# Patient Record
Sex: Female | Born: 1965 | Race: White | Hispanic: No | Marital: Married | Smoking: Former smoker
Health system: Southern US, Community
[De-identification: ages and names within clinical notes are randomized; demographics above are authoritative.]

## PROBLEM LIST (undated history)

## (undated) DIAGNOSIS — Z9889 Other specified postprocedural states: Secondary | ICD-10-CM

## (undated) DIAGNOSIS — R232 Flushing: Principal | ICD-10-CM

## (undated) DIAGNOSIS — L4050 Arthropathic psoriasis, unspecified: Secondary | ICD-10-CM

## (undated) DIAGNOSIS — E119 Type 2 diabetes mellitus without complications: Secondary | ICD-10-CM

## (undated) DIAGNOSIS — C801 Malignant (primary) neoplasm, unspecified: Secondary | ICD-10-CM

## (undated) DIAGNOSIS — I10 Essential (primary) hypertension: Secondary | ICD-10-CM

## (undated) DIAGNOSIS — F419 Anxiety disorder, unspecified: Secondary | ICD-10-CM

## (undated) DIAGNOSIS — L408 Other psoriasis: Secondary | ICD-10-CM

## (undated) DIAGNOSIS — E785 Hyperlipidemia, unspecified: Secondary | ICD-10-CM

## (undated) DIAGNOSIS — R000 Tachycardia, unspecified: Secondary | ICD-10-CM

## (undated) DIAGNOSIS — N189 Chronic kidney disease, unspecified: Secondary | ICD-10-CM

## (undated) DIAGNOSIS — N393 Stress incontinence (female) (male): Secondary | ICD-10-CM

## (undated) DIAGNOSIS — Z79890 Hormone replacement therapy (postmenopausal): Secondary | ICD-10-CM

## (undated) DIAGNOSIS — Z780 Asymptomatic menopausal state: Secondary | ICD-10-CM

## (undated) DIAGNOSIS — L308 Other specified dermatitis: Secondary | ICD-10-CM

## (undated) HISTORY — DX: Flushing: R23.2

## (undated) HISTORY — DX: Malignant (primary) neoplasm, unspecified: C80.1

## (undated) HISTORY — DX: Stress incontinence (female) (male): N39.3

## (undated) HISTORY — DX: Asymptomatic menopausal state: Z78.0

## (undated) HISTORY — PX: CARDIAC ELECTROPHYSIOLOGY MAPPING AND ABLATION: SHX1292

## (undated) HISTORY — PX: CHOLECYSTECTOMY: SHX55

## (undated) HISTORY — PX: TUBAL LIGATION: SHX77

## (undated) HISTORY — DX: Arthropathic psoriasis, unspecified: L40.50

## (undated) HISTORY — DX: Essential (primary) hypertension: I10

## (undated) HISTORY — DX: Anxiety disorder, unspecified: F41.9

## (undated) HISTORY — DX: Other psoriasis: L40.8

## (undated) HISTORY — DX: Type 2 diabetes mellitus without complications: E11.9

## (undated) HISTORY — DX: Other specified dermatitis: L30.8

## (undated) HISTORY — DX: Tachycardia, unspecified: R00.0

## (undated) HISTORY — DX: Hyperlipidemia, unspecified: E78.5

## (undated) HISTORY — PX: ENDOMETRIAL ABLATION: SHX621

## (undated) HISTORY — DX: Hormone replacement therapy: Z79.890

## (undated) HISTORY — DX: Chronic kidney disease, unspecified: N18.9

---

## 2001-01-01 ENCOUNTER — Other Ambulatory Visit: Admission: RE | Admit: 2001-01-01 | Discharge: 2001-01-01 | Payer: Self-pay | Admitting: Obstetrics and Gynecology

## 2001-01-18 ENCOUNTER — Ambulatory Visit (HOSPITAL_COMMUNITY): Admission: RE | Admit: 2001-01-18 | Discharge: 2001-01-18 | Payer: Self-pay | Admitting: Obstetrics and Gynecology

## 2001-11-22 ENCOUNTER — Encounter: Payer: Self-pay | Admitting: Urology

## 2001-11-22 ENCOUNTER — Ambulatory Visit (HOSPITAL_COMMUNITY): Admission: RE | Admit: 2001-11-22 | Discharge: 2001-11-22 | Payer: Self-pay | Admitting: Urology

## 2001-11-25 ENCOUNTER — Other Ambulatory Visit: Admission: RE | Admit: 2001-11-25 | Discharge: 2001-11-25 | Payer: Self-pay | Admitting: Dermatology

## 2004-02-04 ENCOUNTER — Ambulatory Visit (HOSPITAL_COMMUNITY): Admission: RE | Admit: 2004-02-04 | Discharge: 2004-02-04 | Payer: Self-pay | Admitting: Obstetrics and Gynecology

## 2005-01-06 ENCOUNTER — Ambulatory Visit (HOSPITAL_COMMUNITY): Admission: RE | Admit: 2005-01-06 | Discharge: 2005-01-06 | Payer: Self-pay | Admitting: Surgery

## 2006-02-02 ENCOUNTER — Ambulatory Visit (HOSPITAL_COMMUNITY): Admission: RE | Admit: 2006-02-02 | Discharge: 2006-02-02 | Payer: Self-pay | Admitting: Obstetrics & Gynecology

## 2007-02-01 ENCOUNTER — Ambulatory Visit (HOSPITAL_COMMUNITY): Admission: RE | Admit: 2007-02-01 | Discharge: 2007-02-01 | Payer: Self-pay | Admitting: Obstetrics and Gynecology

## 2007-02-18 ENCOUNTER — Other Ambulatory Visit: Admission: RE | Admit: 2007-02-18 | Discharge: 2007-02-18 | Payer: Self-pay | Admitting: Obstetrics and Gynecology

## 2008-02-20 ENCOUNTER — Other Ambulatory Visit: Admission: RE | Admit: 2008-02-20 | Discharge: 2008-02-20 | Payer: Self-pay | Admitting: Obstetrics and Gynecology

## 2008-02-26 ENCOUNTER — Ambulatory Visit (HOSPITAL_COMMUNITY): Admission: RE | Admit: 2008-02-26 | Discharge: 2008-02-26 | Payer: Self-pay | Admitting: Obstetrics and Gynecology

## 2009-03-24 ENCOUNTER — Ambulatory Visit (HOSPITAL_COMMUNITY): Admission: RE | Admit: 2009-03-24 | Discharge: 2009-03-24 | Payer: Self-pay | Admitting: Obstetrics and Gynecology

## 2010-09-30 NOTE — Op Note (Signed)
NAME:  Rebekah Ferrell, Rebekah Ferrell            ACCOUNT NO.:  [id number]   MEDICAL RECORD NO.:  [id number]          PATIENT TYPE:  AMB   LOCATION:  DAY                           FACILITY:  [facility]   PHYSICIAN:  Tilda Burrow, M.D. DATE OF BIRTH:  05-25-1965   DATE OF PROCEDURE:  02/04/2004  DATE OF DISCHARGE:                                 OPERATIVE REPORT   PREOPERATIVE DIAGNOSIS:  Menometrorrhagia.   POSTOPERATIVE DIAGNOSIS:  Menometrorrhagia.   PROCEDURE:  Hysteroscopy, dilation and curettage, endometrial ablation.   SURGEON:  Tilda Burrow, M.D.   ASSISTANTAmie Critchley, C.S.T.   ANESTHESIA:  Dorisann Frames, C.R.N.A.   COMPLICATIONS:  None.   FINDINGS:  Uterus sounding to 8 cm preprocedure.  Smooth endometrial cavity  without polyps or fibroids.   DESCRIPTION OF PROCEDURE:  The patient was taken to the operating room.  General anesthesia was introduced.  A speculum was inserted.  The cervix was  grasped, and a paracervical block was applied with Marcaine solution with  epinephrine.   The cervix was sounded to 8 cm, dilated to 31 [place], allowing introduction  of a 30-degree diagnostic hysteroscope, with visualization of a very thin,  smooth, uniform endometrial contour.  Smooth sharp curettage was performed  enough to obtain a small sample, and then we proceeded with endometrial  ablation.  The ablation consisted of priming the Gynecare ThermaChoice Third  Generation balloon in standard fashion at -150 mmHg, inserting it, dilating  it with 10 cc of D5W, heating to 87 degrees, and performing the standard  eight-minute heat cycle, followed by deflation of the balloon, obtaining all  10 cc of fluid, removal of instruments, and allowing the patient to go to  the recovery room in good condition.  She was discharged, with medications  being Darvocet-N 100 and Motrin.     John   JVF/MEDQ  D:  02/04/2004  T:  02/04/2004  Job:  [id number]

## 2010-09-30 NOTE — H&P (Signed)
NAME:  Rebekah Ferrell, Rebekah Ferrell NO.:  [id number]   MEDICAL RECORD NO.:  [id number]           PATIENT TYPE:   LOCATION:                                 FACILITY:   PHYSICIAN:  Tilda Burrow, M.D.      DATE OF BIRTH:   DATE OF ADMISSION:  02/04/2004  DATE OF DISCHARGE:  LH                                HISTORY & PHYSICAL   ADMISSION DIAGNOSES:  1.  Heavy and prolonged menses (menometrorrhagia).  2.  Type 1 diabetes.   HISTORY OF PRESENT ILLNESS:  This 45 year old female, gravida 2, para 2, AB1  (twins x 1), status post tubal ligation, is admitted at this time for  hysteroscopy D&C and endometrial ablation.  [name] has developed  progressively intolerable periods.  She has been followed in our office.  She is now bleeding 10 to 12 days per month with occasional intramenstrual  bleeding.  She is diabetic and hypertensive, and oral contraceptives are not  considered an option.  She has had an ultrasound in our office which shows  essentially normal size uterus measuring 7.6 x 3.9 x 4.9 cm, total volume  estimated at 76 ml with smooth endometrial cavity and 3 mm endometrial  stripe.  Potential for endometrial pathology is considered minimal.  She had  counseling over treatment options at this time and is familiar with  endometrial ablation from review of Gynecare ThermaChoice endometrial  ablation brochures, discussion in the office, and Internet research.  She  understands the proposed procedure and requests endometrial ablation.  Ten  percent failure rates are being quoted to patient with 90% patient  satisfaction due to diminishing or eliminated menses.  Surgery is scheduled  for February 04, 2004, at [facility].   PAST MEDICAL HISTORY:  1.  Type 1 diabetes beginning in patient's 20's.  2.  History of kidney stones.  3.  History of hepatitis A as a teenager.  4.  History of HELLP syndrome with her pregnancy (hemolysis, elevated      enzymes, and low  platelets).   PAST SURGICAL HISTORY:  1.  Cholecystectomy in 2000.  2.  Tubal ligation in 2002.   OBSTETRICAL HISTORY:  Twin pregnancy with vaginal delivery x 2 in 1999.   MEDICATIONS:  1.  Humulin 75/25 mix with 12 to 15 units q.a.m., with 35 to 50 units      q.p.m., taken on sliding scale based on food intake.  2.  Lisinopril.  3.  Spironolactone managed by Dr. Patrecia Pace.  4.  Lexapro.   SOCIAL HISTORY:  Nonsmoker, nondrinker.  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  Menstrual flow has been heavy for most of the last year with  premenstrual mood changes noted as well.   PHYSICAL EXAMINATION:  GENERAL:  Healthy-appearing, Caucasian female, alert  and oriented x 3.  VITAL SIGNS:  Weight 184, blood pressure 130/70, height 5 feet 2 inches.  HEENT:  Pupils equal, round, and reactive.  Extraocular movements intact.  NECK:  Supple.  CHEST:  Clear to auscultation.  ABDOMEN:  Moderate abdominal wall thickening without masses.  PELVIC:  External genitalia normal.  Cervix multiparous.  Class I Pap smear  August  2005.  Uterus anteflexed with small measurements estimated at 76 ml  total volume with thin endometrial stripe.  Adnexa is negative for  pathology.   IMPRESSION:  1.  Menometrorrhagia.  2.  Type 1 diabetes.  3.  Chronic hypertension.   PLAN:  Hysteroscopy D&C endometrial ablation February 04, 2004.      JVF/MEDQ  D:  01/31/2004  T:  01/31/2004  Job:  [id number]   cc:   [facility]  Fax: [phone]

## 2010-09-30 NOTE — Op Note (Signed)
[facility]  Patient:    Rebekah Ferrell, Rebekah Ferrell Visit Number: [id number] MRN: [id number]          Service Type: Attending:  Christin Bach, M.D. Dictated by:   Christin Bach, M.D. Proc. Date: 01/18/01                             Operative Report  PREOPERATIVE DIAGNOSIS:  Elective sterilization.  POSTOPERATIVE DIAGNOSIS:  Elective sterilization.  PROCEDURE:  Laparoscopic tubal sterilization with Falope rings.  SURGEON:  Christin Bach, M.D.  ASSISTANT:  None.  ANESTHESIA:  General with endotracheal intubation.  COMPLICATIONS:  None.  ESTIMATED BLOOD LOSS:  Minimal.  FINDINGS:  Stenotic cervix, unable to insert a Hulka tenaculum, managed with a single-tooth tenaculum.  INDICATION:  Elective permanent sterilization.  DETAILS OF PROCEDURE:  The patient was taken to the operating room, prepped and draped for a combined abdominal and vaginal procedure, with Hulka tenaculum attached to the cervix for uterine manipulation. Bladder in-and-out catheterization. An infraumbilical, 1 cm vertical incision, as well as a transverse suprapubic 1 cm incision. Veress needle was used to introduce pneumoperitoneum through the umbilical incision with the pneumoperitoneum easily introduced under 10 mmHg of pressure. Introduction of the Veress needle was done, carefully elevating the abdominal wall and orienting the needle toward the pelvis.  The laparoscopic trocar was then carefully introduced into the abdomen using a similar technique, and the laparoscope was used to visualize normal pelvic anatomy with no evidence of bleeding or trauma. The suprapubic trocar was introduced under direct visualization, and then attention was directed to the left fallopian tube, which was identified up to its fimbriated end, elevated and a mid-segment loop of the tube was drawn up into the Falope ring applier, Marcaine 0.25% applied to the surface of the tube and the Falope ring  applied, inspected, and found to be in satisfactory position. The opposite tube was then treated in a similar fashion. The mesosalpinx beneath the Falope ring on each side was then infiltrated with approximately 3 cc of Marcaine 0.25%, using a transabdominal approach with a 22-gauge spinal needle.  Then, the laparoscopic equipment was removed after instilling 200 cc of saline into the abdomen and deflating the abdomen. Subcuticular 4-0 Dexon was used to close the skin incisions and Steri-Strips was placed on the skin surface. Sponge and needle counts were correct. The patient tolerated the procedure well, was awakened, and went to the recovery room in good condition. Dictated by:   Christin Bach, M.D. Attending:  Christin Bach, M.D. DD:  02/17/01 TD:  02/18/01 Job: [id number]

## 2011-11-23 ENCOUNTER — Other Ambulatory Visit: Payer: Self-pay | Admitting: Obstetrics and Gynecology

## 2011-11-23 DIAGNOSIS — Z139 Encounter for screening, unspecified: Secondary | ICD-10-CM

## 2011-11-28 ENCOUNTER — Ambulatory Visit (HOSPITAL_COMMUNITY)
Admission: RE | Admit: 2011-11-28 | Discharge: 2011-11-28 | Disposition: A | Discharge status: Home / Self Care | Payer: BC Managed Care – PPO | Source: Ambulatory Visit | Attending: Obstetrics and Gynecology | Admitting: Obstetrics and Gynecology

## 2011-11-28 DIAGNOSIS — Z1231 Encounter for screening mammogram for malignant neoplasm of breast: Secondary | ICD-10-CM | POA: Insufficient documentation

## 2011-11-28 DIAGNOSIS — Z139 Encounter for screening, unspecified: Secondary | ICD-10-CM

## 2011-12-13 ENCOUNTER — Other Ambulatory Visit: Payer: Self-pay | Admitting: Adult Health

## 2011-12-13 ENCOUNTER — Other Ambulatory Visit (HOSPITAL_COMMUNITY)
Admission: RE | Admit: 2011-12-13 | Discharge: 2011-12-13 | Disposition: A | Discharge status: Home / Self Care | Payer: BC Managed Care – PPO | Source: Ambulatory Visit | Attending: Obstetrics and Gynecology | Admitting: Obstetrics and Gynecology

## 2011-12-13 DIAGNOSIS — Z1151 Encounter for screening for human papillomavirus (HPV): Secondary | ICD-10-CM | POA: Insufficient documentation

## 2011-12-13 DIAGNOSIS — Z01419 Encounter for gynecological examination (general) (routine) without abnormal findings: Secondary | ICD-10-CM | POA: Insufficient documentation

## 2012-12-10 ENCOUNTER — Other Ambulatory Visit: Payer: Self-pay | Admitting: Physician Assistant

## 2013-03-26 ENCOUNTER — Other Ambulatory Visit: Payer: Self-pay | Admitting: Physician Assistant

## 2013-07-15 ENCOUNTER — Encounter: Payer: Self-pay | Admitting: Adult Health

## 2013-07-15 ENCOUNTER — Ambulatory Visit (INDEPENDENT_AMBULATORY_CARE_PROVIDER_SITE_OTHER): Payer: BC Managed Care – PPO | Admitting: Adult Health

## 2013-07-15 VITALS — BP 120/76 | Ht 63.0 in | Wt 188.0 lb

## 2013-07-15 DIAGNOSIS — E119 Type 2 diabetes mellitus without complications: Secondary | ICD-10-CM

## 2013-07-15 DIAGNOSIS — N951 Menopausal and female climacteric states: Secondary | ICD-10-CM

## 2013-07-15 DIAGNOSIS — R232 Flushing: Secondary | ICD-10-CM

## 2013-07-15 HISTORY — DX: Flushing: R23.2

## 2013-07-15 MED ORDER — ALPRAZOLAM 0.5 MG PO TABS: 0.5000 mg | ORAL_TABLET | Freq: Three times a day (TID) | ORAL | Status: DC | PRN

## 2013-07-15 NOTE — Progress Notes (Signed)
Subjective:     Patient ID: Rebekah Ferrell, female   DOB: March 30, 1966, 48 y.o.   MRN: [id number]  HPI Rebekah Ferrell is a 48 year old white female in complaining of hot flashes, night sweats and moodiness, she has no period sp ablation.She is diabetic.  Review of Systems See HPI Reviewed past medical,surgical, social and family history. Reviewed medications and allergies.     Objective:   Physical Exam BP 120/76  Ht 5\' 3"  (1.6 m)  Wt 188 lb (85.276 kg)  BMI 33.31 kg/m2   Discussed menopausal symptoms and treatment options, will check labs first, then decide on treatment.Pt is aware of risks and benefits of options.  Assessment:     Hot flashes Diabetes     Plan:    Refilled xanax 0.5 mg #90 1 tid prn with 3 refills  Check CBC,CMP,TSH, FSH and A1c   Review handout on menopause Will talk tomorrow

## 2013-07-15 NOTE — Patient Instructions (Signed)
Menopause Menopause is the normal time of life when menstrual periods stop completely. Menopause is complete when you have missed 12 consecutive menstrual periods. It usually occurs between the ages of 8 years and 48 years. Very rarely does a woman develop menopause before the age of 60 years. At menopause, your ovaries stop producing the female hormones estrogen and progesterone. This can cause undesirable symptoms and also affect your health. Sometimes the symptoms may occur 4 5 years before the menopause begins. There is no relationship between menopause and:  Oral contraceptives.  Number of children you had.  Race.  The age your menstrual periods started (menarche). Heavy smokers and very thin women may develop menopause earlier in life. CAUSES  The ovaries stop producing the female hormones estrogen and progesterone.  Other causes include:  Surgery to remove both ovaries.  The ovaries stop functioning for no known reason.  Tumors of the pituitary gland in the brain.  Medical disease that affects the ovaries and hormone production.  Radiation treatment to the abdomen or pelvis.  Chemotherapy that affects the ovaries. SYMPTOMS   Hot flashes.  Night sweats.  Decrease in sex drive.  Vaginal dryness and thinning of the vagina causing painful intercourse.  Dryness of the skin and developing wrinkles.  Headaches.  Tiredness.  Irritability.  Memory problems.  Weight gain.  Bladder infections.  Hair growth of the face and chest.  Infertility. More serious symptoms include:  Loss of bone (osteoporosis) causing breaks (fractures).  Depression.  Hardening and narrowing of the arteries (atherosclerosis) causing heart attacks and strokes. DIAGNOSIS   When the menstrual periods have stopped for 12 straight months.  Physical exam.  Hormone studies of the blood. TREATMENT  There are many treatment choices and nearly as many questions about them. The  decisions to treat or not to treat menopausal changes is an individual choice made with your health care provider. Your health care provider can discuss the treatments with you. Together, you can decide which treatment will work best for you. Your treatment choices may include:   Hormone therapy (estrogen and progesterone).  Non-hormonal medicines.  Treating the individual symptoms with medicine (for example antidepressants for depression).  Herbal medicines that may help specific symptoms.  Counseling by a psychiatrist or psychologist.  Group therapy.  Lifestyle changes including:  Eating healthy.  Regular exercise.  Limiting caffeine and alcohol.  Stress management and meditation.  No treatment. HOME CARE INSTRUCTIONS   Take the medicine your health care provider gives you as directed.  Get plenty of sleep and rest.  Exercise regularly.  Eat a diet that contains calcium (good for the bones) and soy products (acts like estrogen hormone).  Avoid alcoholic beverages.  Do not smoke.  If you have hot flashes, dress in layers.  Take supplements, calcium, and vitamin D to strengthen bones.  You can use over-the-counter lubricants or moisturizers for vaginal dryness.  Group therapy is sometimes very helpful.  Acupuncture may be helpful in some cases. SEEK MEDICAL CARE IF:   You are not sure you are in menopause.  You are having menopausal symptoms and need advice and treatment.  You are still having menstrual periods after age 71 years.  You have pain with intercourse.  Menopause is complete (no menstrual period for 12 months) and you develop vaginal bleeding.  You need a referral to a specialist (gynecologist, psychiatrist, or psychologist) for treatment. SEEK IMMEDIATE MEDICAL CARE IF:   You have severe depression.  You have excessive vaginal bleeding.  You fell and think you have a broken bone.  You have pain when you urinate.  You develop leg or  chest pain.  You have a fast pounding heart beat (palpitations).  You have severe headaches.  You develop vision problems.  You feel a lump in your breast.  You have abdominal pain or severe indigestion. Document Released: 07/22/2003 Document Revised: 01/01/2013 Document Reviewed: 11/28/2012 [facility] Patient Information 2014 [place], [place].

## 2013-07-16 LAB — CBC
HCT: 38.9 % (ref 36.0–46.0)
Hemoglobin: 13.1 g/dL (ref 12.0–15.0)
MCH: 29.4 pg (ref 26.0–34.0)
MCHC: 33.7 g/dL (ref 30.0–36.0)
MCV: 87.2 fL (ref 78.0–100.0)
PLATELETS: 335 10*3/uL (ref 150–400)
RBC: 4.46 MIL/uL (ref 3.87–5.11)
RDW: 13.4 % (ref 11.5–15.5)
WBC: 8.6 10*3/uL (ref 4.0–10.5)

## 2013-07-16 LAB — COMPREHENSIVE METABOLIC PANEL
ALBUMIN: 4.7 g/dL (ref 3.5–5.2)
ALT: 80 U/L — AB (ref 0–35)
AST: 38 U/L — AB (ref 0–37)
Alkaline Phosphatase: 76 U/L (ref 39–117)
BUN: 24 mg/dL — ABNORMAL HIGH (ref 6–23)
CALCIUM: 9.9 mg/dL (ref 8.4–10.5)
CHLORIDE: 98 meq/L (ref 96–112)
CO2: 25 meq/L (ref 19–32)
Creat: 0.77 mg/dL (ref 0.50–1.10)
Glucose, Bld: 180 mg/dL — ABNORMAL HIGH (ref 70–99)
POTASSIUM: 4.8 meq/L (ref 3.5–5.3)
SODIUM: 135 meq/L (ref 135–145)
TOTAL PROTEIN: 7.6 g/dL (ref 6.0–8.3)
Total Bilirubin: 0.6 mg/dL (ref 0.2–1.2)

## 2013-07-16 LAB — HEMOGLOBIN A1C
Hgb A1c MFr Bld: 9 % — ABNORMAL HIGH (ref <5.7)
MEAN PLASMA GLUCOSE: 212 mg/dL — AB (ref <117)

## 2013-07-16 LAB — TSH: TSH: 2.098 u[IU]/mL (ref 0.350–4.500)

## 2013-07-16 LAB — FOLLICLE STIMULATING HORMONE: FSH: 91.2 m[IU]/mL

## 2013-07-17 ENCOUNTER — Other Ambulatory Visit: Payer: Self-pay | Admitting: Adult Health

## 2013-07-17 ENCOUNTER — Telehealth: Payer: Self-pay | Admitting: Adult Health

## 2013-07-17 MED ORDER — NORETHINDRONE-ETH ESTRADIOL 1-5 MG-MCG PO TABS: 1.0000 | ORAL_TABLET | Freq: Every day | ORAL | Status: DC

## 2013-07-17 NOTE — Telephone Encounter (Signed)
Pt aware of labs, will rx HRT and will send copies to Dr Dorris Fetch when she gets appt.

## 2013-08-05 ENCOUNTER — Telehealth: Payer: Self-pay | Admitting: Adult Health

## 2013-08-05 NOTE — Telephone Encounter (Signed)
Left message to call back in am

## 2013-08-05 NOTE — Telephone Encounter (Signed)
Pt states been on HRT x 2 weeks continues to have hot flashes, awake all night long, night sweats. Pt states no improvement at all with HRT.

## 2013-08-06 ENCOUNTER — Telehealth: Payer: Self-pay

## 2013-08-06 NOTE — Telephone Encounter (Signed)
Call routed to Derrek Monaco, NP

## 2013-08-06 NOTE — Telephone Encounter (Signed)
Rebekah Ferrell still having hot flashes and not sleeping will continue meds for 1 more month if not better will try another HRT

## 2013-09-25 ENCOUNTER — Telehealth: Payer: Self-pay | Admitting: Adult Health

## 2013-09-25 NOTE — Telephone Encounter (Signed)
Does not need labs but might want to try another formulation of estrogen and progesterone

## 2013-09-25 NOTE — Telephone Encounter (Signed)
Pt states that she is still having hot flashes, she is scheduled to have blood work in near future and wanted to know if you wanted her to have her hormones checked. And should she continue taking medication or change to something else?

## 2013-09-29 ENCOUNTER — Encounter: Payer: Self-pay | Admitting: Adult Health

## 2013-09-29 ENCOUNTER — Ambulatory Visit (INDEPENDENT_AMBULATORY_CARE_PROVIDER_SITE_OTHER): Payer: BC Managed Care – PPO | Admitting: Adult Health

## 2013-09-29 VITALS — BP 120/76 | Ht 63.0 in | Wt 187.0 lb

## 2013-09-29 DIAGNOSIS — N951 Menopausal and female climacteric states: Secondary | ICD-10-CM

## 2013-09-29 DIAGNOSIS — Z780 Asymptomatic menopausal state: Secondary | ICD-10-CM

## 2013-09-29 DIAGNOSIS — R232 Flushing: Secondary | ICD-10-CM

## 2013-09-29 DIAGNOSIS — F411 Generalized anxiety disorder: Secondary | ICD-10-CM

## 2013-09-29 DIAGNOSIS — F419 Anxiety disorder, unspecified: Secondary | ICD-10-CM

## 2013-09-29 HISTORY — DX: Asymptomatic menopausal state: Z78.0

## 2013-09-29 MED ORDER — CONJ ESTROGENS-BAZEDOXIFENE 0.45-20 MG PO TABS: ORAL_TABLET | ORAL | Status: DC

## 2013-09-29 MED ORDER — SERTRALINE HCL 25 MG PO TABS: 25.0000 mg | ORAL_TABLET | Freq: Every day | ORAL | Status: DC

## 2013-09-29 NOTE — Progress Notes (Addendum)
Subjective:     Patient ID: Timoteo Expose, female   DOB: [birth year], 48 y.o.   MRN: [id number]  HPI Anedra is a 48 year old white female, married in complaining of hot flashes and not sleeping and moody even with Femhrt.Feels not like self, having to handle estate of Dad who passed recently. Feels like in fog at times.  Review of Systems See HPI Reviewed past medical,surgical, social and family history. Reviewed medications and allergies.     Objective:   Physical Exam BP 120/76  Ht 5\' 3"  (1.6 m)  Wt 187 lb (84.823 kg)  BMI 33.13 kg/m2   talk only see HPI, will change HRT and add SSRI and we talked about letting things go and taking time for self. Offered counseling but declines at present.  Assessment:     Hot flashes Anxiety Menopause     Plan:     Try duavee #56 tabs [id number] exp 5/17, reviewed risk/benefits   Rx zoloft 25 mg #30 1 daily with 6 refills Follow up in 6 week Review handout on menopause

## 2013-09-29 NOTE — Patient Instructions (Signed)
Menopause Menopause is the normal time of life when menstrual periods stop completely. Menopause is complete when you have missed 12 consecutive menstrual periods. It usually occurs between the ages of 8 years and 48 years. Very rarely does a woman develop menopause before the age of 60 years. At menopause, your ovaries stop producing the female hormones estrogen and progesterone. This can cause undesirable symptoms and also affect your health. Sometimes the symptoms may occur 4 5 years before the menopause begins. There is no relationship between menopause and:  Oral contraceptives.  Number of children you had.  Race.  The age your menstrual periods started (menarche). Heavy smokers and very thin women may develop menopause earlier in life. CAUSES  The ovaries stop producing the female hormones estrogen and progesterone.  Other causes include:  Surgery to remove both ovaries.  The ovaries stop functioning for no known reason.  Tumors of the pituitary gland in the brain.  Medical disease that affects the ovaries and hormone production.  Radiation treatment to the abdomen or pelvis.  Chemotherapy that affects the ovaries. SYMPTOMS   Hot flashes.  Night sweats.  Decrease in sex drive.  Vaginal dryness and thinning of the vagina causing painful intercourse.  Dryness of the skin and developing wrinkles.  Headaches.  Tiredness.  Irritability.  Memory problems.  Weight gain.  Bladder infections.  Hair growth of the face and chest.  Infertility. More serious symptoms include:  Loss of bone (osteoporosis) causing breaks (fractures).  Depression.  Hardening and narrowing of the arteries (atherosclerosis) causing heart attacks and strokes. DIAGNOSIS   When the menstrual periods have stopped for 12 straight months.  Physical exam.  Hormone studies of the blood. TREATMENT  There are many treatment choices and nearly as many questions about them. The  decisions to treat or not to treat menopausal changes is an individual choice made with your health care provider. Your health care provider can discuss the treatments with you. Together, you can decide which treatment will work best for you. Your treatment choices may include:   Hormone therapy (estrogen and progesterone).  Non-hormonal medicines.  Treating the individual symptoms with medicine (for example antidepressants for depression).  Herbal medicines that may help specific symptoms.  Counseling by a psychiatrist or psychologist.  Group therapy.  Lifestyle changes including:  Eating healthy.  Regular exercise.  Limiting caffeine and alcohol.  Stress management and meditation.  No treatment. HOME CARE INSTRUCTIONS   Take the medicine your health care provider gives you as directed.  Get plenty of sleep and rest.  Exercise regularly.  Eat a diet that contains calcium (good for the bones) and soy products (acts like estrogen hormone).  Avoid alcoholic beverages.  Do not smoke.  If you have hot flashes, dress in layers.  Take supplements, calcium, and vitamin D to strengthen bones.  You can use over-the-counter lubricants or moisturizers for vaginal dryness.  Group therapy is sometimes very helpful.  Acupuncture may be helpful in some cases. SEEK MEDICAL CARE IF:   You are not sure you are in menopause.  You are having menopausal symptoms and need advice and treatment.  You are still having menstrual periods after age 71 years.  You have pain with intercourse.  Menopause is complete (no menstrual period for 12 months) and you develop vaginal bleeding.  You need a referral to a specialist (gynecologist, psychiatrist, or psychologist) for treatment. SEEK IMMEDIATE MEDICAL CARE IF:   You have severe depression.  You have excessive vaginal bleeding.  You fell and think you have a broken bone.  You have pain when you urinate.  You develop leg or  chest pain.  You have a fast pounding heart beat (palpitations).  You have severe headaches.  You develop vision problems.  You feel a lump in your breast.  You have abdominal pain or severe indigestion. Document Released: 07/22/2003 Document Revised: 01/01/2013 Document Reviewed: 11/28/2012 [facility] Patient Information 2014 [place], [place]. Follow up in 6 weeks Take zoloft 1 daily Start duavee

## 2013-11-18 ENCOUNTER — Ambulatory Visit (INDEPENDENT_AMBULATORY_CARE_PROVIDER_SITE_OTHER): Payer: BC Managed Care – PPO | Admitting: Adult Health

## 2013-11-18 ENCOUNTER — Encounter: Payer: Self-pay | Admitting: Adult Health

## 2013-11-18 VITALS — BP 126/70 | Ht 63.0 in | Wt 184.0 lb

## 2013-11-18 DIAGNOSIS — Z780 Asymptomatic menopausal state: Secondary | ICD-10-CM

## 2013-11-18 DIAGNOSIS — N951 Menopausal and female climacteric states: Secondary | ICD-10-CM

## 2013-11-18 DIAGNOSIS — Z79890 Hormone replacement therapy (postmenopausal): Secondary | ICD-10-CM

## 2013-11-18 HISTORY — DX: Hormone replacement therapy: Z79.890

## 2013-11-18 MED ORDER — CONJ ESTROGENS-BAZEDOXIFENE 0.45-20 MG PO TABS: ORAL_TABLET | ORAL | Status: DC

## 2013-11-18 NOTE — Progress Notes (Signed)
Subjective:     Patient ID: Rebekah Ferrell, female   DOB: March 30, 1966, 48 y.o.   MRN: [id number]  HPI Rebekah Ferrell is a 48 year old white female, married, back in follow up of starting [facility] for hot flashes and menopause symptoms.Doing much better, has decrease in flashes, sleeping better and moods better.  Review of Systems See HPI Reviewed past medical,surgical, social and family history. Reviewed medications and allergies.     Objective:   Physical Exam BP 126/70  Ht 5\' 3"  (1.6 m)  Wt 184 lb (83.462 kg)  BMI 32.60 kg/m2   Talk only doing much better,happy with [facility], wants to continue  Assessment:     Menopause  HRT    Plan:     Refilled Duavee 1 daily x 1 year,discount card given Follow up in 3 months for physical

## 2013-11-18 NOTE — Patient Instructions (Signed)
Return in 3 months Continue duavee

## 2014-01-22 ENCOUNTER — Other Ambulatory Visit: Payer: Self-pay | Admitting: Adult Health

## 2014-02-18 ENCOUNTER — Ambulatory Visit (INDEPENDENT_AMBULATORY_CARE_PROVIDER_SITE_OTHER): Payer: BC Managed Care – PPO | Admitting: Adult Health

## 2014-02-18 ENCOUNTER — Encounter: Payer: Self-pay | Admitting: Adult Health

## 2014-02-18 VITALS — BP 112/62 | HR 76 | Ht 62.0 in | Wt 182.0 lb

## 2014-02-18 DIAGNOSIS — Z79890 Hormone replacement therapy (postmenopausal): Secondary | ICD-10-CM

## 2014-02-18 DIAGNOSIS — Z1212 Encounter for screening for malignant neoplasm of rectum: Secondary | ICD-10-CM

## 2014-02-18 DIAGNOSIS — F419 Anxiety disorder, unspecified: Secondary | ICD-10-CM

## 2014-02-18 DIAGNOSIS — Z01419 Encounter for gynecological examination (general) (routine) without abnormal findings: Secondary | ICD-10-CM

## 2014-02-18 LAB — HEMOCCULT GUIAC POC 1CARD (OFFICE): Fecal Occult Blood, POC: NEGATIVE

## 2014-02-18 NOTE — Patient Instructions (Signed)
Physical in 1 year  Mammogram now and yearly Colonoscopy at [facility] dr nida

## 2014-02-18 NOTE — Progress Notes (Signed)
Patient ID: EVA GRIFFO, female   DOB: 1965/08/13, 48 y.o.   MRN: [id number] History of Present Illness: Diavion is a 48 year old white female,married, in for physical.She had a normal pap with negative HPV 12/12/12.She is seeing Dr Dorris Fetch for her diabetes.She is doing well with Duavee.   Current Medications, Allergies, Past Medical History, Past Surgical History, Family History and Social History were reviewed in [employer] record.     Review of Systems: Patient denies any headaches, blurred vision, shortness of breath, chest pain, abdominal pain, problems with bowel movements, urination, or intercourse. No joint pain, no mood changes. Has lost weight.   Physical Exam:BP 112/62  Pulse 76  Ht 5\' 2"  (1.575 m)  Wt 182 lb (82.555 kg)  BMI 33.28 kg/m2 General:  Well developed, well nourished, no acute distress Skin:  Warm and dry Neck:  Midline trachea, normal thyroid Lungs; Clear to auscultation bilaterally Breast:  No dominant palpable mass, retraction, or nipple discharge Cardiovascular: Regular rate and rhythm Abdomen:  Soft, non tender, no hepatosplenomegaly Pelvic:  External genitalia is normal in appearance,excpet has dermatitis left labia.  The vagina is normal in appearance. The cervix is smooth.  Uterus is felt to be normal size, shape, and contour.  No  adnexal masses or tenderness noted. Rectal: Good sphincter tone, no polyps, or hemorrhoids felt.  Hemoccult negative. Extremities:  No swelling or varicosities noted Psych:  No mood changes,alert and cooperative,seems happy   Impression: Well woman gyn exam HRT  Anxiety    Plan: Continue meds, let me know when needs refills Physical and pap in 1 year Mammogram now and yearly Colonoscopy at [address] [facility] with Dr Dorris Fetch

## 2014-03-16 ENCOUNTER — Encounter: Payer: Self-pay | Admitting: Adult Health

## 2014-03-19 ENCOUNTER — Other Ambulatory Visit (HOSPITAL_COMMUNITY): Payer: Self-pay | Admitting: Physician Assistant

## 2014-03-19 ENCOUNTER — Ambulatory Visit (HOSPITAL_COMMUNITY)
Admission: RE | Admit: 2014-03-19 | Discharge: 2014-03-19 | Disposition: A | Discharge status: Home / Self Care | Payer: BC Managed Care – PPO | Source: Ambulatory Visit | Attending: Physician Assistant | Admitting: Physician Assistant

## 2014-03-19 DIAGNOSIS — R0602 Shortness of breath: Secondary | ICD-10-CM | POA: Diagnosis not present

## 2014-03-19 DIAGNOSIS — R0989 Other specified symptoms and signs involving the circulatory and respiratory systems: Secondary | ICD-10-CM | POA: Diagnosis not present

## 2014-03-19 DIAGNOSIS — Z87891 Personal history of nicotine dependence: Secondary | ICD-10-CM | POA: Diagnosis not present

## 2014-03-19 DIAGNOSIS — R0789 Other chest pain: Secondary | ICD-10-CM | POA: Diagnosis present

## 2014-03-19 DIAGNOSIS — E119 Type 2 diabetes mellitus without complications: Secondary | ICD-10-CM | POA: Diagnosis not present

## 2014-03-19 DIAGNOSIS — I10 Essential (primary) hypertension: Secondary | ICD-10-CM | POA: Diagnosis not present

## 2014-03-19 DIAGNOSIS — R05 Cough: Secondary | ICD-10-CM | POA: Insufficient documentation

## 2014-04-27 ENCOUNTER — Other Ambulatory Visit: Payer: Self-pay | Admitting: Adult Health

## 2014-07-23 ENCOUNTER — Other Ambulatory Visit: Payer: Self-pay | Admitting: Adult Health

## 2014-07-23 DIAGNOSIS — Z1231 Encounter for screening mammogram for malignant neoplasm of breast: Secondary | ICD-10-CM

## 2014-07-27 ENCOUNTER — Other Ambulatory Visit: Payer: Self-pay | Admitting: Adult Health

## 2014-08-19 ENCOUNTER — Ambulatory Visit (HOSPITAL_COMMUNITY)
Admission: RE | Admit: 2014-08-19 | Discharge: 2014-08-19 | Disposition: A | Discharge status: Home / Self Care | Payer: BC Managed Care – PPO | Source: Ambulatory Visit | Attending: Adult Health | Admitting: Adult Health

## 2014-08-19 DIAGNOSIS — Z1231 Encounter for screening mammogram for malignant neoplasm of breast: Secondary | ICD-10-CM | POA: Diagnosis not present

## 2014-08-24 ENCOUNTER — Other Ambulatory Visit (HOSPITAL_COMMUNITY): Payer: Self-pay | Admitting: Family Medicine

## 2014-08-24 DIAGNOSIS — R7401 Elevation of levels of liver transaminase levels: Secondary | ICD-10-CM

## 2014-08-24 DIAGNOSIS — R740 Nonspecific elevation of levels of transaminase and lactic acid dehydrogenase [LDH]: Principal | ICD-10-CM

## 2014-08-27 ENCOUNTER — Ambulatory Visit (HOSPITAL_COMMUNITY): Payer: BC Managed Care – PPO

## 2014-09-01 ENCOUNTER — Ambulatory Visit (HOSPITAL_COMMUNITY): Admission: RE | Admit: 2014-09-01 | Payer: BC Managed Care – PPO | Source: Ambulatory Visit

## 2014-09-30 ENCOUNTER — Ambulatory Visit (HOSPITAL_COMMUNITY): Payer: BC Managed Care – PPO

## 2014-11-04 ENCOUNTER — Emergency Department (HOSPITAL_COMMUNITY)
Admission: EM | Admit: 2014-11-04 | Discharge: 2014-11-04 | Disposition: A | Discharge status: Home / Self Care | Payer: BC Managed Care – PPO | Attending: Emergency Medicine | Admitting: Emergency Medicine

## 2014-11-04 ENCOUNTER — Encounter (HOSPITAL_COMMUNITY): Payer: Self-pay | Admitting: Emergency Medicine

## 2014-11-04 DIAGNOSIS — E119 Type 2 diabetes mellitus without complications: Secondary | ICD-10-CM | POA: Diagnosis not present

## 2014-11-04 DIAGNOSIS — Z7982 Long term (current) use of aspirin: Secondary | ICD-10-CM | POA: Diagnosis not present

## 2014-11-04 DIAGNOSIS — Z780 Asymptomatic menopausal state: Secondary | ICD-10-CM | POA: Diagnosis not present

## 2014-11-04 DIAGNOSIS — L509 Urticaria, unspecified: Secondary | ICD-10-CM

## 2014-11-04 DIAGNOSIS — N189 Chronic kidney disease, unspecified: Secondary | ICD-10-CM | POA: Insufficient documentation

## 2014-11-04 DIAGNOSIS — Z794 Long term (current) use of insulin: Secondary | ICD-10-CM | POA: Diagnosis not present

## 2014-11-04 DIAGNOSIS — F419 Anxiety disorder, unspecified: Secondary | ICD-10-CM | POA: Insufficient documentation

## 2014-11-04 DIAGNOSIS — I129 Hypertensive chronic kidney disease with stage 1 through stage 4 chronic kidney disease, or unspecified chronic kidney disease: Secondary | ICD-10-CM | POA: Insufficient documentation

## 2014-11-04 DIAGNOSIS — Z85828 Personal history of other malignant neoplasm of skin: Secondary | ICD-10-CM | POA: Diagnosis not present

## 2014-11-04 DIAGNOSIS — Z87891 Personal history of nicotine dependence: Secondary | ICD-10-CM | POA: Diagnosis not present

## 2014-11-04 DIAGNOSIS — R21 Rash and other nonspecific skin eruption: Secondary | ICD-10-CM | POA: Diagnosis present

## 2014-11-04 DIAGNOSIS — Z79899 Other long term (current) drug therapy: Secondary | ICD-10-CM | POA: Diagnosis not present

## 2014-11-04 DIAGNOSIS — Z87442 Personal history of urinary calculi: Secondary | ICD-10-CM | POA: Insufficient documentation

## 2014-11-04 DIAGNOSIS — Z79890 Hormone replacement therapy (postmenopausal): Secondary | ICD-10-CM | POA: Insufficient documentation

## 2014-11-04 LAB — CBG MONITORING, ED: Glucose-Capillary: 236 mg/dL — ABNORMAL HIGH (ref 65–99)

## 2014-11-04 MED ORDER — DIPHENHYDRAMINE HCL 50 MG/ML IJ SOLN
25.0000 mg | Freq: Once | INTRAMUSCULAR | Status: AC
  Administered 2014-11-04: 25 mg via INTRAVENOUS
  Filled 2014-11-04: qty 1

## 2014-11-04 MED ORDER — FAMOTIDINE 20 MG PO TABS: 20.0000 mg | ORAL_TABLET | Freq: Every day | ORAL | Status: DC

## 2014-11-04 MED ORDER — EPINEPHRINE HCL 1 MG/ML IJ SOLN
0.3000 mg | Freq: Once | INTRAMUSCULAR | Status: AC
  Administered 2014-11-04: 0.3 mg via INTRAMUSCULAR

## 2014-11-04 MED ORDER — HYDROXYZINE HCL 25 MG PO TABS
50.0000 mg | ORAL_TABLET | Freq: Once | ORAL | Status: AC
  Administered 2014-11-04: 50 mg via ORAL
  Filled 2014-11-04: qty 2

## 2014-11-04 MED ORDER — HYDROXYZINE HCL 50 MG PO TABS: 50.0000 mg | ORAL_TABLET | Freq: Four times a day (QID) | ORAL | Status: DC | PRN

## 2014-11-04 MED ORDER — FAMOTIDINE IN NACL 20-0.9 MG/50ML-% IV SOLN
20.0000 mg | Freq: Once | INTRAVENOUS | Status: AC
  Administered 2014-11-04: 20 mg via INTRAVENOUS
  Filled 2014-11-04: qty 50

## 2014-11-04 MED ORDER — EPINEPHRINE HCL 1 MG/ML IJ SOLN
0.3000 mg | Freq: Once | INTRAMUSCULAR | Status: DC
  Filled 2014-11-04: qty 1

## 2014-11-04 MED ORDER — METHYLPREDNISOLONE SODIUM SUCC 125 MG IJ SOLR
125.0000 mg | Freq: Once | INTRAMUSCULAR | Status: AC
  Administered 2014-11-04: 125 mg via INTRAVENOUS
  Filled 2014-11-04: qty 2

## 2014-11-04 MED ORDER — EPINEPHRINE 0.3 MG/0.3ML IJ SOAJ: INTRAMUSCULAR | Status: DC

## 2014-11-04 NOTE — ED Notes (Signed)
Pt reporting slightly less itching, and red, raised areas do appear to be somewhat improved.  However pt still reporting that she does still feel the itching. Pt reports feeling very sleepy. Resting more quietly at this time.

## 2014-11-04 NOTE — ED Provider Notes (Addendum)
CSN: [id number]     Arrival date & time 11/04/14  0120 History   First MD Initiated Contact with Patient 11/04/14 0129     Chief Complaint  Patient presents with  . Allergic Reaction     (Consider location/radiation/quality/duration/timing/severity/associated sxs/prior Treatment) HPI   this is a 49 year old female  History of diabetes, hypertension, chronic kidney disease who presents with urticaria. Patient reports she developed a rash early Monday. The rash involved her lower extremities and her back. Throughout the course of the day, she noted swelling to her lips and "looks like the nutty professor." Patient took Benadryl  With some improvement of this facial swelling but with persistent worsening itchiness of the rash. Was seen by her primary physician on Tuesday and given a "steroid shot." she was instructed to continue Benadryl.    She was also given doxycycline to cover for any possible tickborne illness.  Patient reports that she's had no relief of Benadryl at home and feels the rash is getting worse. She has not had any recurrence of lip swelling. She denies any shortness of breath. She has not had any new exposures including soaps, detergents, medications, foods.   Patient reports that her mother had a severe Stevens-Johnson's reaction and she is concerned for this.  Past Medical History  Diagnosis Date  . Diabetes mellitus without complication   . Hypertension   . Chronic kidney disease     kidney stone  . Cancer     skin   . Anxiety   . Hot flashes 07/15/2013  . Spongiotic psoriasiform dermatitis   . Menopause 09/29/2013  . Hormone replacement therapy (HRT) 11/18/2013   Past Surgical History  Procedure Laterality Date  . Tubal ligation    . Cholecystectomy    . Endometrial ablation     Family History  Problem Relation Age of Onset  . Hypertension Mother   . Brain cancer Mother   . Diabetes Father   . Hypertension Father   . Cancer Father     bladder  . Diabetes  Sister   . Cancer Maternal Grandmother     breast  . Stroke Maternal Grandmother    History  Substance Use Topics  . Smoking status: Former Smoker    Types: Cigarettes  . Smokeless tobacco: Never Used  . Alcohol Use: No   OB History    Gravida Para Term Preterm AB TAB SAB Ectopic Multiple Living   1 1       1 2      Review of Systems  Constitutional: Negative for fever.  HENT: Positive for facial swelling.   Respiratory: Negative for chest tightness, shortness of breath and wheezing.   Cardiovascular: Negative for chest pain.  Gastrointestinal: Negative for nausea and vomiting.  Genitourinary: Negative for dysuria.  Skin: Positive for rash.  Neurological: Negative for headaches.  All other systems reviewed and are negative.     Allergies  Levaquin and Sulfa antibiotics  Home Medications   Prior to Admission medications   Medication Sig Start Date End Date Taking? Authorizing Provider  ALPRAZolam (XANAX) 0.5 MG tablet TAKE 1 TABLET BY MOUTH 3 TIMES A DAY AS NEEDED FOR ANXIETY. 07/27/14  Yes Estill Dooms, NP  aspirin 81 MG tablet Take 81 mg by mouth daily.   Yes Historical Provider, MD  Biotin 5000 MCG CAPS Take 1 capsule by mouth daily.   Yes Historical Provider, MD  insulin glargine (LANTUS) 100 UNIT/ML injection Inject 42 Units into the skin at bedtime.  Yes Historical Provider, MD  insulin lispro (HUMALOG) 100 UNIT/ML injection Inject into the skin 4 (four) times daily.   Yes Historical Provider, MD  lisinopril (PRINIVIL,ZESTRIL) 20 MG tablet Take 20 mg by mouth daily.   Yes Historical Provider, MD  loratadine (CLARITIN) 10 MG tablet Take 10 mg by mouth daily.   Yes Historical Provider, MD  metFORMIN (GLUCOPHAGE) 500 MG tablet Take 500 mg by mouth 2 (two) times daily with a meal.   Yes Historical Provider, MD  naproxen sodium (ANAPROX) 220 MG tablet Take 220 mg by mouth 2 (two) times daily with a meal.   Yes Historical Provider, MD  sertraline (ZOLOFT) 25 MG  tablet TAKE ONE TABLET BY MOUTH ONCE DAILY. 04/27/14  Yes Estill Dooms, NP  simvastatin (ZOCOR) 20 MG tablet Take 20 mg by mouth daily.   Yes Historical Provider, MD  spironolactone (ALDACTONE) 50 MG tablet Take 50 mg by mouth 2 (two) times daily.   Yes Historical Provider, MD  Conj Estrogens-Bazedoxifene (DUAVEE) 0.45-20 MG TABS Take 1 daily 11/18/13   Estill Dooms, NP  TRULICITY 1.61 WR/6.0AV SOPN once a week.  01/27/14   Historical Provider, MD   BP 119/57 mmHg  Pulse 90  Temp(Src) 97.9 F (36.6 C) (Oral)  Resp 18  Ht 5\' 2"  (1.575 m)  Wt 185 lb (83.915 kg)  BMI 33.83 kg/m2  SpO2 97% Physical Exam  Constitutional: She is oriented to person, place, and time. She appears well-developed and well-nourished.   Uncomfortable appearing  HENT:  Head: Normocephalic and atraumatic.  Mouth/Throat: Oropharynx is clear and moist.   No appreciable swelling noted,  Uvula midline  Eyes: Pupils are equal, round, and reactive to light.  Neck: Neck supple.  Cardiovascular: Normal rate, regular rhythm and normal heart sounds.   No murmur heard. Pulmonary/Chest: Effort normal and breath sounds normal. No respiratory distress. She has no wheezes.  Musculoskeletal: She exhibits no edema.  Neurological: She is alert and oriented to person, place, and time.  Skin: Skin is warm and dry.   Urticaria noted over the bilateral  Proximal lower extremities,  Bilateral upper extremities, and back,  Erythema noted of the bilateral cheeks  Psychiatric: She has a normal mood and affect.  Nursing note and vitals reviewed.   ED Course  Procedures (including critical care time)  CRITICAL CARE Performed by: Merryl Hacker   Total critical care time: 35 min  Critical care time was exclusive of separately billable procedures and treating other patients.  Critical care was necessary to treat or prevent imminent or life-threatening deterioration.  Critical care was time spent personally by me on  the following activities: development of treatment plan with patient and/or surrogate as well as nursing, discussions with consultants, evaluation of patient's response to treatment, examination of patient, obtaining history from patient or surrogate, ordering and performing treatments and interventions, ordering and review of laboratory studies, ordering and review of radiographic studies, pulse oximetry and re-evaluation of patient's condition.  Labs Review Labs Reviewed  CBG MONITORING, ED - Abnormal; Notable for the following:    Glucose-Capillary 236 (*)    All other components within normal limits    Imaging Review No results found.   EKG Interpretation None      MDM   Final diagnoses:  None   Patient presents with rash. Rash is consistent with urticaria. Reports that she had systemic symptoms that has since resolved. She reports the thoracic progressive despite still voids earlier today and Benadryl.  She denies any fevers or other systemic symptoms or recent illness. She is otherwise nontoxic on exam. No current evidence of anaphylaxis. Patient given IV Benadryl and Pepcid. Blood glucose elevated  236.   Would like to avoid steroid administration of that all possible.  4:00 AM  on recheck, patient reports persistent pruritus and no improvement. Patient was given Solu-Medrol. Discuss with patient that if her hives do not defervesce or she does not get symptom control, we may need to resort to epinephrine. We'll continue to monitor this point.  6:36 AM  Patient now reports that she is sleepy but continues to have intense pruritus.   Extent of itching and rash as well as previous systemic symptoms, will give 0.3 mg of epinephrine IM and monitor for improvement.    Merryl Hacker, MD 11/04/14 0865  Merryl Hacker, MD 11/04/14 [phone]

## 2014-11-04 NOTE — Discharge Instructions (Signed)
You were seen today for hives. Ultimately you had to receive epinephrine for hives to improve. The cause of your hives is unknown.   You should continue Pepcid and Benadryl at home. He will also be given a prescription for Atarax for itching. Take steroids as prescribed by her primary physician.    You'll be given a prescription for an EpiPen that should be used only in severe allergic reaction or anaphylaxis.  Hives Hives are itchy, red, swollen areas of the skin. They can vary in size and location on your body. Hives can come and go for hours or several days (acute hives) or for several weeks (chronic hives). Hives do not spread from person to person (noncontagious). They may get worse with scratching, exercise, and emotional stress. CAUSES   Allergic reaction to food, additives, or drugs.  Infections, including the common cold.  Illness, such as vasculitis, lupus, or thyroid disease.  Exposure to sunlight, heat, or cold.  Exercise.  Stress.  Contact with chemicals. SYMPTOMS   Red or white swollen patches on the skin. The patches may change size, shape, and location quickly and repeatedly.  Itching.  Swelling of the hands, feet, and face. This may occur if hives develop deeper in the skin. DIAGNOSIS  Your caregiver can usually tell what is wrong by performing a physical exam. Skin or blood tests may also be done to determine the cause of your hives. In some cases, the cause cannot be determined. TREATMENT  Mild cases usually get better with medicines such as antihistamines. Severe cases may require an emergency epinephrine injection. If the cause of your hives is known, treatment includes avoiding that trigger.  HOME CARE INSTRUCTIONS   Avoid causes that trigger your hives.  Take antihistamines as directed by your caregiver to reduce the severity of your hives. Non-sedating or low-sedating antihistamines are usually recommended. Do not drive while taking an antihistamine.  Take  any other medicines prescribed for itching as directed by your caregiver.  Wear loose-fitting clothing.  Keep all follow-up appointments as directed by your caregiver. SEEK MEDICAL CARE IF:   You have persistent or severe itching that is not relieved with medicine.  You have painful or swollen joints. SEEK IMMEDIATE MEDICAL CARE IF:   You have a fever.  Your tongue or lips are swollen.  You have trouble breathing or swallowing.  You feel tightness in the throat or chest.  You have abdominal pain. These problems may be the first sign of a life-threatening allergic reaction. Call your local emergency services (911 in [place] MAKE SURE YOU:   Understand these instructions.  Will watch your condition.  Will get help right away if you are not doing well or get worse. Document Released: 05/01/2005 Document Revised: 05/06/2013 Document Reviewed: 07/25/2011 [facility] Patient Information 2015 [place], [place]. This information is not intended to replace advice given to you by your health care provider. Make sure you discuss any questions you have with your health care provider.

## 2014-11-04 NOTE — ED Provider Notes (Signed)
Pt observed for ~ 3 hours after epi. Feels "much better." Reports progressive improvement in symptoms. Nontoxic. Mild hypotension noted, but pt denies dizziness, lightheadedness, etc. No GI complaints. No respiratory complaints. I feel she is appropriate discharge at this time. Return precautions discussed.   Virgel Manifold, MD 11/04/14 [phone]

## 2014-11-04 NOTE — ED Notes (Signed)
Meal tray given 

## 2014-11-04 NOTE — ED Notes (Signed)
Pt c/o rash all over and seen pcp today, but is not getting any better.

## 2014-11-09 ENCOUNTER — Emergency Department (HOSPITAL_COMMUNITY)
Admission: EM | Admit: 2014-11-09 | Discharge: 2014-11-09 | Disposition: A | Discharge status: Home / Self Care | Payer: BC Managed Care – PPO | Attending: Emergency Medicine | Admitting: Emergency Medicine

## 2014-11-09 ENCOUNTER — Encounter (HOSPITAL_COMMUNITY): Payer: Self-pay | Admitting: *Deleted

## 2014-11-09 DIAGNOSIS — Z780 Asymptomatic menopausal state: Secondary | ICD-10-CM | POA: Diagnosis not present

## 2014-11-09 DIAGNOSIS — Z87891 Personal history of nicotine dependence: Secondary | ICD-10-CM | POA: Insufficient documentation

## 2014-11-09 DIAGNOSIS — Z79890 Hormone replacement therapy (postmenopausal): Secondary | ICD-10-CM | POA: Insufficient documentation

## 2014-11-09 DIAGNOSIS — X58XXXD Exposure to other specified factors, subsequent encounter: Secondary | ICD-10-CM | POA: Insufficient documentation

## 2014-11-09 DIAGNOSIS — Z79899 Other long term (current) drug therapy: Secondary | ICD-10-CM | POA: Diagnosis not present

## 2014-11-09 DIAGNOSIS — Z85828 Personal history of other malignant neoplasm of skin: Secondary | ICD-10-CM | POA: Insufficient documentation

## 2014-11-09 DIAGNOSIS — Z87442 Personal history of urinary calculi: Secondary | ICD-10-CM | POA: Insufficient documentation

## 2014-11-09 DIAGNOSIS — E119 Type 2 diabetes mellitus without complications: Secondary | ICD-10-CM | POA: Insufficient documentation

## 2014-11-09 DIAGNOSIS — I129 Hypertensive chronic kidney disease with stage 1 through stage 4 chronic kidney disease, or unspecified chronic kidney disease: Secondary | ICD-10-CM | POA: Insufficient documentation

## 2014-11-09 DIAGNOSIS — L539 Erythematous condition, unspecified: Secondary | ICD-10-CM | POA: Diagnosis not present

## 2014-11-09 DIAGNOSIS — N189 Chronic kidney disease, unspecified: Secondary | ICD-10-CM | POA: Insufficient documentation

## 2014-11-09 DIAGNOSIS — Z791 Long term (current) use of non-steroidal anti-inflammatories (NSAID): Secondary | ICD-10-CM | POA: Diagnosis not present

## 2014-11-09 DIAGNOSIS — F419 Anxiety disorder, unspecified: Secondary | ICD-10-CM | POA: Diagnosis not present

## 2014-11-09 DIAGNOSIS — Z7982 Long term (current) use of aspirin: Secondary | ICD-10-CM | POA: Diagnosis not present

## 2014-11-09 DIAGNOSIS — Z794 Long term (current) use of insulin: Secondary | ICD-10-CM | POA: Insufficient documentation

## 2014-11-09 DIAGNOSIS — T7840XD Allergy, unspecified, subsequent encounter: Secondary | ICD-10-CM | POA: Diagnosis present

## 2014-11-09 MED ORDER — ONDANSETRON HCL 4 MG/2ML IJ SOLN
4.0000 mg | Freq: Once | INTRAMUSCULAR | Status: AC
  Administered 2014-11-09: 4 mg via INTRAVENOUS
  Filled 2014-11-09: qty 2

## 2014-11-09 MED ORDER — DIPHENHYDRAMINE HCL 50 MG/ML IJ SOLN
25.0000 mg | Freq: Once | INTRAMUSCULAR | Status: AC
  Administered 2014-11-09: 25 mg via INTRAVENOUS
  Filled 2014-11-09: qty 1

## 2014-11-09 MED ORDER — FAMOTIDINE IN NACL 20-0.9 MG/50ML-% IV SOLN
20.0000 mg | Freq: Once | INTRAVENOUS | Status: AC
  Administered 2014-11-09: 20 mg via INTRAVENOUS
  Filled 2014-11-09: qty 50

## 2014-11-09 MED ORDER — METHYLPREDNISOLONE SODIUM SUCC 125 MG IJ SOLR
125.0000 mg | Freq: Once | INTRAMUSCULAR | Status: AC
  Administered 2014-11-09: 125 mg via INTRAVENOUS
  Filled 2014-11-09: qty 2

## 2014-11-09 MED ORDER — SODIUM CHLORIDE 0.9 % IV BOLUS (SEPSIS)
500.0000 mL | Freq: Once | INTRAVENOUS | Status: AC
  Administered 2014-11-09: 500 mL via INTRAVENOUS

## 2014-11-09 NOTE — ED Notes (Signed)
Hives x 1 week.  Has been to PCP Tuesday and ER that night. Has taken Bendryl and Steroids, but none since yesterday.  Can't see allergist until July 20.  Hives over face, trunk and to knees.  Cannot relate any new product or food use or sun exposure contributing to hives. Breathing is normal, regular w/out wheezing.  No difficulty swallowing.

## 2014-11-09 NOTE — ED Notes (Signed)
Dr Lacinda Axon in room - informed pt that he has been able to get appointment for her with another allergist tomorrow morning  At 0830am in [place] .

## 2014-11-09 NOTE — ED Notes (Signed)
Pt left with husband - Verbalized undertanding re- fu with allergist in am

## 2014-11-09 NOTE — Discharge Instructions (Signed)
Follow up Dr Ishmael Holter, [address] [place], [place] [address],   [phone],    Tomorrow at 830 am.

## 2014-11-09 NOTE — ED Notes (Signed)
Almost immediately after administrating benadryl IV pt suddenly started co feeling nauseated and as if she was going to pass out- color pale- skin warm and dry - MD informed and order given for zofran - VS obtained - Zofran administered - pt remained alert and responsive - color improved .

## 2014-11-09 NOTE — ED Provider Notes (Signed)
CSN: [id number]     Arrival date & time 11/09/14  1259 History   First MD Initiated Contact with Patient 11/09/14 1539     Chief Complaint  Patient presents with  . Allergic Reaction     (Consider location/radiation/quality/duration/timing/severity/associated sxs/prior Treatment) HPI... Third physician visit in the past 8 days for allergic reaction;  second visit to the emergency department. Prednisone just ended yesterday. Symptoms started approximately 8 days ago with hives on her legs, abdomen, thigh, back, arms, face, lips.  She has been on steroids and Benadryl until yesterday. No known inciting events or allergens. This is never happened before. Patient is a type I diabetic. Severity is moderate.  Past Medical History  Diagnosis Date  . Diabetes mellitus without complication   . Hypertension   . Chronic kidney disease     kidney stone  . Cancer     skin   . Anxiety   . Hot flashes 07/15/2013  . Spongiotic psoriasiform dermatitis   . Menopause 09/29/2013  . Hormone replacement therapy (HRT) 11/18/2013   Past Surgical History  Procedure Laterality Date  . Tubal ligation    . Cholecystectomy    . Endometrial ablation     Family History  Problem Relation Age of Onset  . Hypertension Mother   . Brain cancer Mother   . Diabetes Father   . Hypertension Father   . Cancer Father     bladder  . Diabetes Sister   . Cancer Maternal Grandmother     breast  . Stroke Maternal Grandmother    History  Substance Use Topics  . Smoking status: Former Smoker    Types: Cigarettes  . Smokeless tobacco: Never Used  . Alcohol Use: No   OB History    Gravida Para Term Preterm AB TAB SAB Ectopic Multiple Living   1 1       1 2      Review of Systems  All other systems reviewed and are negative.     Allergies  Levaquin and Sulfa antibiotics  Home Medications   Prior to Admission medications   Medication Sig Start Date End Date Taking? Authorizing Provider  ALPRAZolam (XANAX)  0.5 MG tablet TAKE 1 TABLET BY MOUTH 3 TIMES A DAY AS NEEDED FOR ANXIETY. 07/27/14  Yes Estill Dooms, NP  aspirin 81 MG tablet Take 81 mg by mouth daily.   Yes Historical Provider, MD  Biotin 5000 MCG CAPS Take 1 capsule by mouth daily.   Yes Historical Provider, MD  EPINEPHrine 0.3 mg/0.3 mL IJ SOAJ injection Inject once into the muscle as needed for severe allergic reaction or anaphylaxis 11/04/14  Yes Merryl Hacker, MD  famotidine (PEPCID) 20 MG tablet Take 1 tablet (20 mg total) by mouth daily. 11/04/14  Yes Merryl Hacker, MD  hydrOXYzine (ATARAX/VISTARIL) 50 MG tablet Take 1 tablet (50 mg total) by mouth every 6 (six) hours as needed for itching. 11/04/14  Yes Merryl Hacker, MD  insulin glargine (LANTUS) 100 UNIT/ML injection Inject 46 Units into the skin at bedtime.    Yes Historical Provider, MD  insulin lispro (HUMALOG) 100 UNIT/ML injection Inject into the skin 4 (four) times daily.   Yes Historical Provider, MD  INVOKANA 300 MG TABS tablet Take 300 mg by mouth daily before breakfast.  11/03/14  Yes Historical Provider, MD  lisinopril (PRINIVIL,ZESTRIL) 20 MG tablet Take 20 mg by mouth daily.   Yes Historical Provider, MD  loratadine (CLARITIN) 10 MG tablet Take 10  mg by mouth daily.   Yes Historical Provider, MD  metFORMIN (GLUCOPHAGE) 500 MG tablet Take 500 mg by mouth 2 (two) times daily with a meal.   Yes Historical Provider, MD  naproxen sodium (ANAPROX) 220 MG tablet Take 220 mg by mouth 2 (two) times daily with a meal.   Yes Historical Provider, MD  sertraline (ZOLOFT) 25 MG tablet TAKE ONE TABLET BY MOUTH ONCE DAILY. 04/27/14  Yes Estill Dooms, NP  simvastatin (ZOCOR) 20 MG tablet Take 20 mg by mouth daily.   Yes Historical Provider, MD  spironolactone (ALDACTONE) 50 MG tablet Take 50 mg by mouth 2 (two) times daily.   Yes Historical Provider, MD  Conj Estrogens-Bazedoxifene (DUAVEE) 0.45-20 MG TABS Take 1 daily Patient not taking: Reported on 11/09/2014 11/18/13    Estill Dooms, NP   BP 113/68 mmHg  Pulse 76  Resp 18  Ht 5\' 2"  (1.575 m)  Wt 180 lb (81.647 kg)  BMI 32.91 kg/m2  SpO2 97% Physical Exam  Constitutional: She is oriented to person, place, and time. She appears well-developed and well-nourished.  No respiratory distress  HENT:  Head: Normocephalic and atraumatic.  Eyes: Conjunctivae and EOM are normal. Pupils are equal, round, and reactive to light.  Neck: Normal range of motion. Neck supple.  Cardiovascular: Normal rate and regular rhythm.   Pulmonary/Chest: Effort normal and breath sounds normal.  Abdominal: Soft. Bowel sounds are normal.  Musculoskeletal: Normal range of motion.  Neurological: She is alert and oriented to person, place, and time.  Skin:  Area of erythema on abdomen, thighs, face, chin, neck  Psychiatric: She has a normal mood and affect. Her behavior is normal.  Nursing note and vitals reviewed.   ED Course  Procedures (including critical care time) Labs Review Labs Reviewed - No data to display  Imaging Review No results found.   EKG Interpretation None      MDM   Final diagnoses:  Allergic reaction, subsequent encounter    Patient is hemodynamically stable. Rx IV Solu-Medrol, IV Benadryl, IV Pepcid. Patient will see Dr. Ishmael Holter [allergist] tomorrow June 28 at 8:30 AM    Nat Christen, MD 11/09/14 [phone]

## 2014-11-25 ENCOUNTER — Other Ambulatory Visit: Payer: Self-pay | Admitting: Adult Health

## 2014-11-30 ENCOUNTER — Other Ambulatory Visit: Payer: Self-pay | Admitting: Adult Health

## 2014-12-15 ENCOUNTER — Telehealth: Payer: Self-pay | Admitting: Adult Health

## 2014-12-15 MED ORDER — SERTRALINE HCL 50 MG PO TABS: 50.0000 mg | ORAL_TABLET | Freq: Every day | ORAL | Status: DC

## 2014-12-15 NOTE — Telephone Encounter (Signed)
May need increased of zoloft, having to go out of town , will  rx zoloft 50 mg #900 take 1 daily with 3 refills

## 2015-02-12 ENCOUNTER — Other Ambulatory Visit: Payer: Self-pay | Admitting: Adult Health

## 2015-03-01 IMAGING — CR DG CHEST 2V
2 series · 2 of 2 positions shown · non-contrast
Comparison: Thoracic spine series of January 07, 2007

CLINICAL DATA: Chest tightness, palpitation associated with
shortness of breath cough and congestion for 5 days 6 days ; history
of previous tobacco use, diabetes, and hypertension

EXAM:
CHEST  2 VIEW

[view not recorded (1 of 2)]
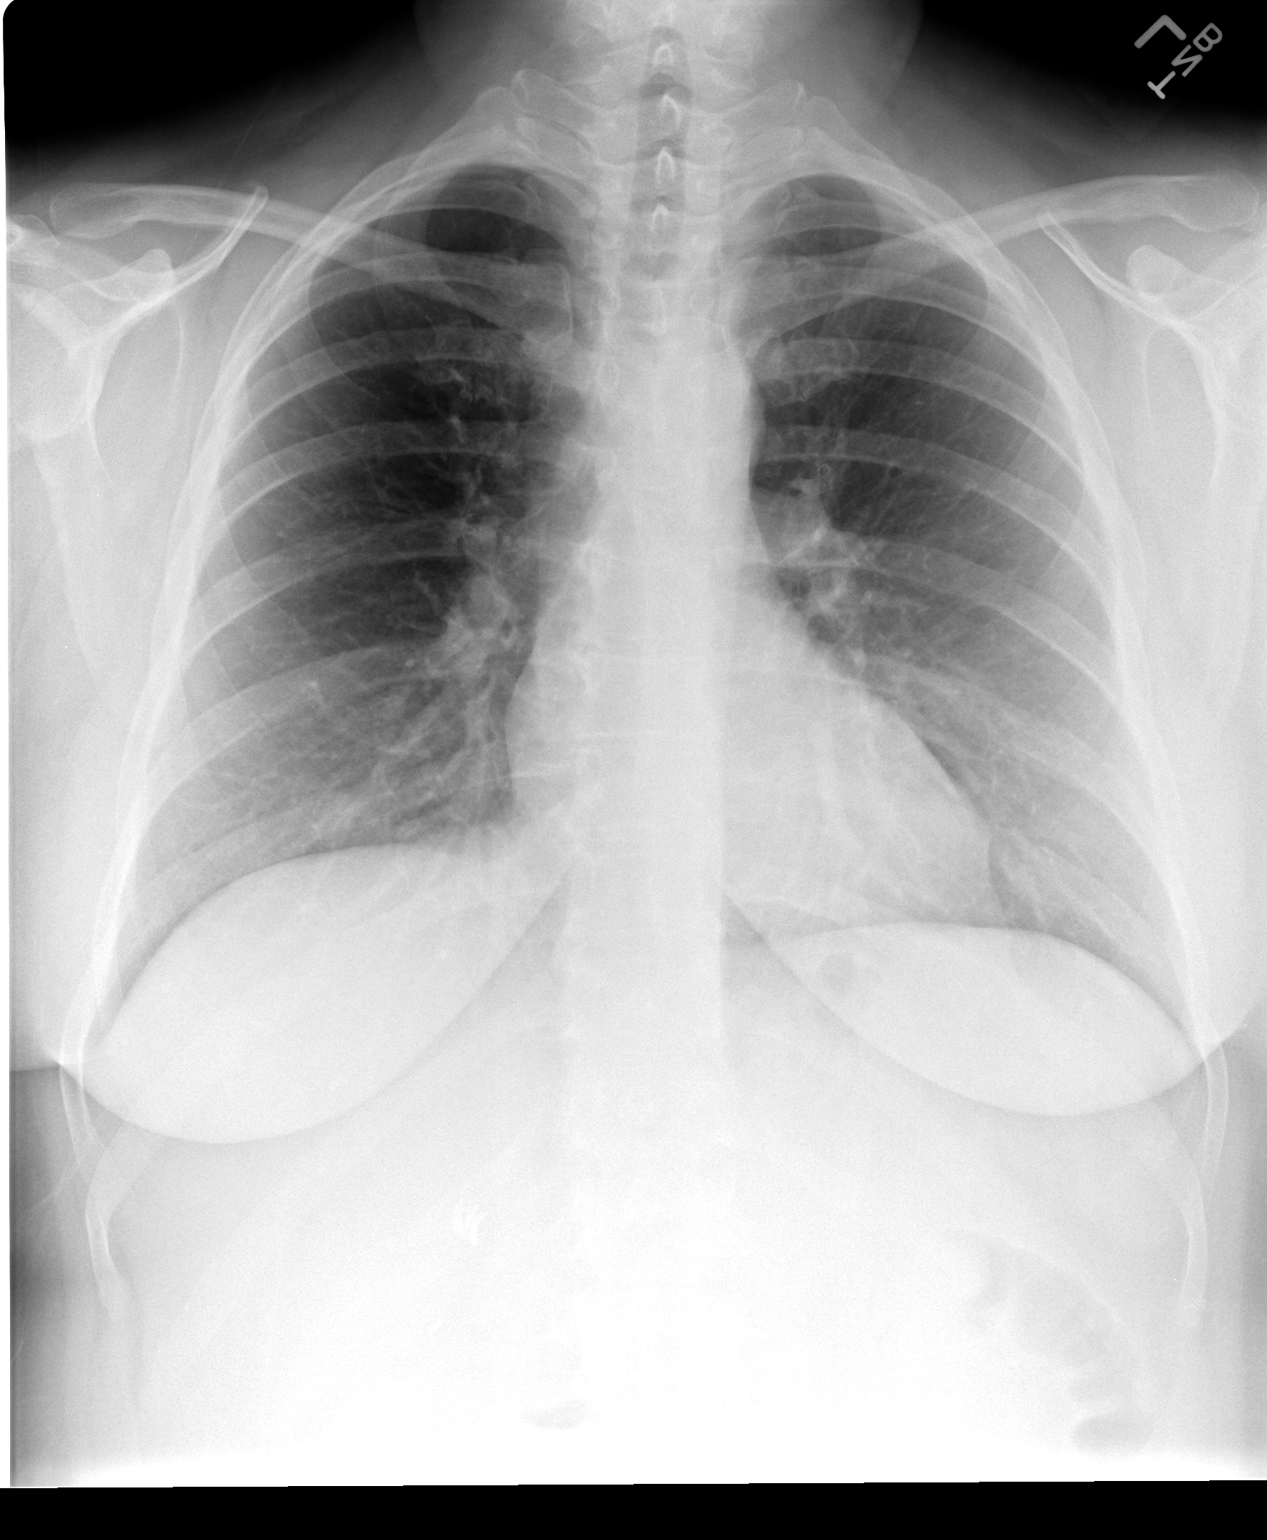

[view not recorded (2 of 2)]
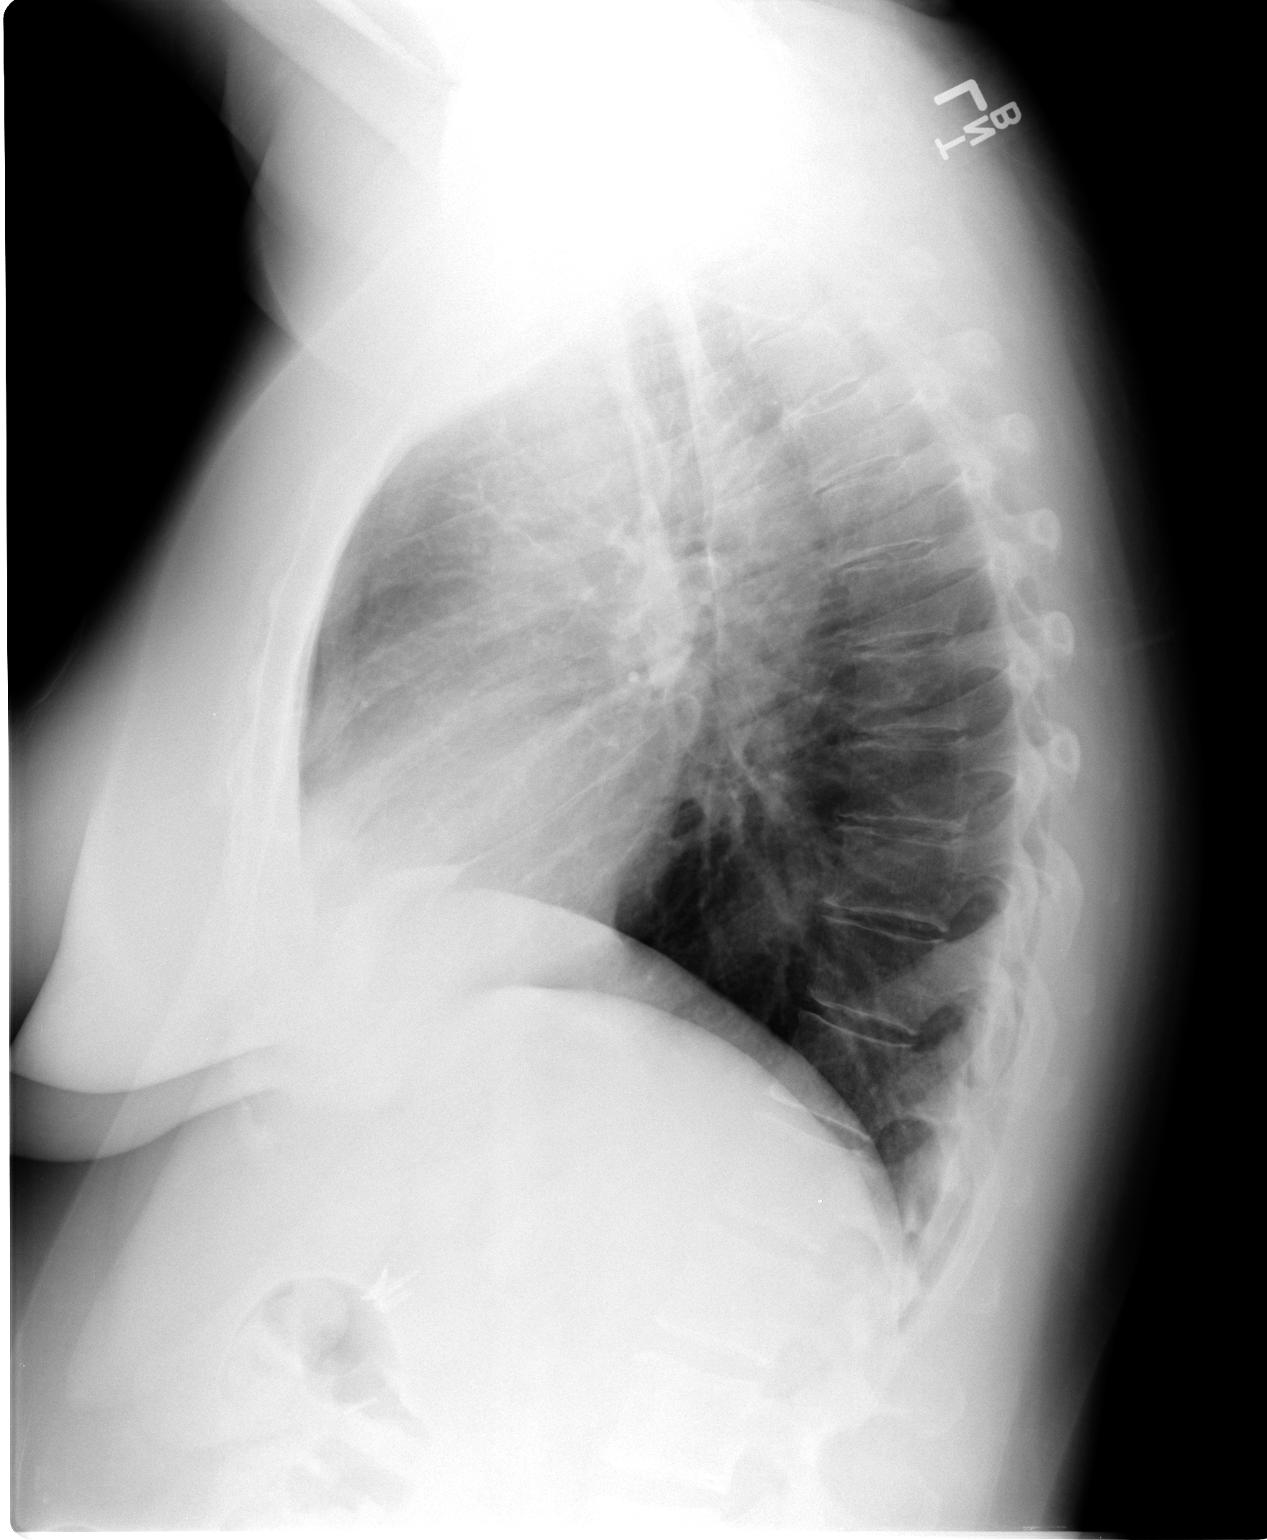

[2 of 2 positions shown; findings below may reference images not displayed]

FINDINGS: The lungs are adequately inflated and clear. The heart and pulmonary
vascularity are normal. The mediastinum is normal in width. There is
no pleural effusion or pneumothorax. The observed bony thorax is
unremarkable.
IMPRESSION: There is no evidence of pneumonia nor other acute cardiopulmonary
abnormality.

## 2015-08-25 ENCOUNTER — Other Ambulatory Visit: Payer: Self-pay | Admitting: Adult Health

## 2015-12-02 ENCOUNTER — Other Ambulatory Visit: Payer: Self-pay | Admitting: Adult Health

## 2015-12-24 ENCOUNTER — Telehealth: Payer: Self-pay | Admitting: Adult Health

## 2015-12-24 NOTE — Telephone Encounter (Signed)
Mailbox full @ 11:30 am. [employer]

## 2015-12-24 NOTE — Telephone Encounter (Signed)
Spoke with pt letting her know JAG was out of the office until Tuesday but I would route the note to her. Pt voiced understanding. [place]

## 2015-12-28 NOTE — Telephone Encounter (Signed)
Fells lump left breast, needs pap and physical 8/23 at 11:30 am

## 2016-01-05 ENCOUNTER — Ambulatory Visit (INDEPENDENT_AMBULATORY_CARE_PROVIDER_SITE_OTHER): Payer: BC Managed Care – PPO | Admitting: Adult Health

## 2016-01-05 ENCOUNTER — Other Ambulatory Visit (HOSPITAL_COMMUNITY)
Admission: RE | Admit: 2016-01-05 | Discharge: 2016-01-05 | Disposition: A | Discharge status: Home / Self Care | Payer: BC Managed Care – PPO | Source: Ambulatory Visit | Attending: Adult Health | Admitting: Adult Health

## 2016-01-05 ENCOUNTER — Encounter: Payer: Self-pay | Admitting: Adult Health

## 2016-01-05 VITALS — BP 152/80 | HR 72 | Ht 62.0 in | Wt 176.0 lb

## 2016-01-05 DIAGNOSIS — N63 Unspecified lump in breast: Secondary | ICD-10-CM | POA: Diagnosis not present

## 2016-01-05 DIAGNOSIS — Z01419 Encounter for gynecological examination (general) (routine) without abnormal findings: Secondary | ICD-10-CM | POA: Insufficient documentation

## 2016-01-05 DIAGNOSIS — F419 Anxiety disorder, unspecified: Secondary | ICD-10-CM

## 2016-01-05 DIAGNOSIS — Z1151 Encounter for screening for human papillomavirus (HPV): Secondary | ICD-10-CM | POA: Insufficient documentation

## 2016-01-05 DIAGNOSIS — Z1211 Encounter for screening for malignant neoplasm of colon: Secondary | ICD-10-CM

## 2016-01-05 DIAGNOSIS — Z01411 Encounter for gynecological examination (general) (routine) with abnormal findings: Secondary | ICD-10-CM

## 2016-01-05 DIAGNOSIS — R1032 Left lower quadrant pain: Secondary | ICD-10-CM | POA: Insufficient documentation

## 2016-01-05 DIAGNOSIS — N6324 Unspecified lump in the left breast, lower inner quadrant: Secondary | ICD-10-CM

## 2016-01-05 LAB — HEMOCCULT GUIAC POC 1CARD (OFFICE): FECAL OCCULT BLD: NEGATIVE

## 2016-01-05 MED ORDER — NYSTATIN-TRIAMCINOLONE 100000-0.1 UNIT/GM-% EX CREA: 1.0000 "application " | TOPICAL_CREAM | Freq: Two times a day (BID) | CUTANEOUS | 1 refills | Status: DC

## 2016-01-05 NOTE — Patient Instructions (Addendum)
Physical in 1 year pap in 3 if normal Mammogram at [facility] 8/29 at 1 pm  Return in 2 weeks for [place] Colonoscopy at 71

## 2016-01-05 NOTE — Progress Notes (Signed)
Patient ID: Rebekah Ferrell, female   DOB: Sep 08, 1965, 50 y.o.   MRN: [id number] History of Present Illness: Rebekah Ferrell is a 50 year old white female, married in for a well woman gyn exam and pap.She complains of left breast lump for several weeks and LLQ pain at times.She has occasional hot flash and if she coughs and sneezes has to cross her legs or will leak.She says zoloft and xanax has helped her anxiety.She just sent her twin boys off to college.  PCP is S.Jackson PA at [place].   Current Medications, Allergies, Past Medical History, Past Surgical History, Family History and Social History were reviewed in [employer] record.     Review of Systems: Patient denies any headaches, hearing loss, fatigue, blurred vision, shortness of breath, chest pain, abdominal pain, problems with bowel movements, urination(occasional SUI), or intercourse. No joint pain or mood swings. See HPI for positives.    Physical Exam:BP (!) 152/80 (BP Location: Left Arm, Patient Position: Sitting, Cuff Size: Normal)   Pulse 72   Ht 5\' 2"  (1.575 m)   Wt 176 lb (79.8 kg)   LMP 11/28/2011   BMI 32.19 kg/m  General:  Well developed, well nourished, no acute distress Skin:  Warm and dry Neck:  Midline trachea, normal thyroid, good ROM, no lymphadenopathy Lungs; Clear to auscultation bilaterally Breast:  No dominant palpable mass, retraction, or nipple discharge on right, on left, no retraction or nipple discharge, but has lump at 7-8 o'clock 2 FB from nipple, felt when sitting but feels more fibroglandular when laying down and is tender  Cardiovascular: Regular rate and rhythm Abdomen:  Soft, non tender, no hepatosplenomegaly Pelvic:  External genitalia is normal in appearance, no lesions.  The vagina is normal in appearance. Urethra has no lesions or masses. The cervix is smooth, pap with HPV performed. Uterus is felt to be normal size, shape, and contour.  No adnexal masses, +LLQ tenderness  noted.Bladder is non tender, no masses felt. Rectal: Good sphincter tone, no polyps, or hemorrhoids felt.  Hemoccult negative. Extremities/musculoskeletal:  No swelling or varicosities noted, no clubbing or cyanosis, has cracking and peeling in palms of both hands ?psoriac arthritis  Psych:  No mood changes, alert and cooperative,seems happy She says peri area gets dry, itchy and cracks at times too.  Impression:  Encounter for gynecological examination with Papanicolaou smear of cervix - Plan: Cytology - PAP, POCT occult blood stool  Anxiety  Breast lump on left side at 8 o'clock position - Plan: US BREAST LTD UNI LEFT INC AXILLA, US BREAST LTD UNI RIGHT INC AXILLA, MM DIAG BREAST TOMO BILATERAL  LLQ pain - Plan: US Pelvis Complete, US Transvaginal Non-OB    Plan: Diagnostic bilateral mammogram and [place] at [facility] 8/29 at 1 pm  Return 9/5 for GYN [place]  Rx mytrex ceam use bid prn with 1 refill Continue zoloft and xanax has refills  Physical in 1 year, pap in 3 if normal Colonoscopy at 51  Labs at PCP

## 2016-01-06 LAB — CYTOLOGY - PAP

## 2016-01-11 ENCOUNTER — Other Ambulatory Visit (HOSPITAL_COMMUNITY): Payer: Self-pay

## 2016-01-11 ENCOUNTER — Ambulatory Visit (HOSPITAL_COMMUNITY)
Admission: RE | Admit: 2016-01-11 | Discharge: 2016-01-11 | Disposition: A | Discharge status: Home / Self Care | Payer: BC Managed Care – PPO | Source: Ambulatory Visit | Attending: Adult Health | Admitting: Adult Health

## 2016-01-11 DIAGNOSIS — N6324 Unspecified lump in the left breast, lower inner quadrant: Secondary | ICD-10-CM

## 2016-01-11 DIAGNOSIS — N63 Unspecified lump in breast: Secondary | ICD-10-CM | POA: Insufficient documentation

## 2016-01-19 ENCOUNTER — Ambulatory Visit (INDEPENDENT_AMBULATORY_CARE_PROVIDER_SITE_OTHER): Payer: BC Managed Care – PPO

## 2016-01-19 DIAGNOSIS — N854 Malposition of uterus: Secondary | ICD-10-CM | POA: Diagnosis not present

## 2016-01-19 DIAGNOSIS — R1032 Left lower quadrant pain: Secondary | ICD-10-CM

## 2016-01-19 DIAGNOSIS — N888 Other specified noninflammatory disorders of cervix uteri: Secondary | ICD-10-CM | POA: Diagnosis not present

## 2016-01-19 NOTE — Progress Notes (Signed)
PELVIC US TA/TV: Homogeneous anteverted uterus w/mult small nabothian cysts,EEC 2.8 mm,normal ov's bilat (mobile),no free fluid,lt adnexal tenderness during ultrasound.

## 2016-01-21 ENCOUNTER — Telehealth: Payer: Self-pay | Admitting: Adult Health

## 2016-01-21 NOTE — Telephone Encounter (Signed)
Pt aware US normal  

## 2016-03-27 ENCOUNTER — Other Ambulatory Visit: Payer: Self-pay | Admitting: Adult Health

## 2016-04-26 ENCOUNTER — Encounter: Payer: Self-pay | Admitting: Adult Health

## 2016-04-26 ENCOUNTER — Ambulatory Visit (INDEPENDENT_AMBULATORY_CARE_PROVIDER_SITE_OTHER): Payer: BC Managed Care – PPO | Admitting: Adult Health

## 2016-04-26 VITALS — BP 112/80 | HR 94 | Ht 62.0 in | Wt 180.0 lb

## 2016-04-26 DIAGNOSIS — F419 Anxiety disorder, unspecified: Secondary | ICD-10-CM | POA: Diagnosis not present

## 2016-04-26 DIAGNOSIS — B369 Superficial mycosis, unspecified: Secondary | ICD-10-CM | POA: Diagnosis not present

## 2016-04-26 MED ORDER — FLUCONAZOLE 100 MG PO TABS: ORAL_TABLET | ORAL | 1 refills | Status: DC

## 2016-04-26 MED ORDER — SERTRALINE HCL 25 MG PO TABS: ORAL_TABLET | ORAL | 1 refills | Status: DC

## 2016-04-26 NOTE — Progress Notes (Signed)
Subjective:     Patient ID: Rebekah Ferrell, female   DOB: 06/03/1965, 50 y.o.   MRN: [id number]  HPI Rebekah Ferrell is a 50 year old white female, married in complaining of yeast, and needs zoloft refilled.  Review of Systems +skin yeast Reviewed past medical,surgical, social and family history. Reviewed medications and allergies.     Objective:   Physical Exam BP 112/80 (BP Location: Right Arm, Patient Position: Sitting)   Pulse 94   Ht 5\' 2"  (1.575 m)   Wt 180 lb (81.6 kg)   LMP 11/28/2011   BMI 32.92 kg/m  PHQ 9 score 3.Skin warm and dry, has red excoriation in groin area and left under arm.Painted groin with gentian violet.Will rx diflucan too.Her hands are cracked, and she has seen dermatologist.   Boys home from college, will increase zoloft to 75 mg for a while then can go back to 50 mg.  Face time 15 minutes.  Assessment:     1. Superficial fungus infection of skin   2. Anxiety       Plan:     Meds ordered this encounter  Medications  . sertraline (ZOLOFT) 25 MG tablet    Sig: Take 3 daily    Dispense:  90 tablet    Refill:  1    Order Specific Question:   Supervising Provider    Answer:   EURE, LUTHER H [[id number]]  . fluconazole (DIFLUCAN) 100 MG tablet    Sig: Take 1 daily for 10 days    Dispense:  10 tablet    Refill:  1    Order Specific Question:   Supervising Provider    Answer:   Florian Buff [[id number]]  Use gentian violet prn  Follow prn

## 2016-04-26 NOTE — Patient Instructions (Signed)
Use gentian violet prn Take diflucan  Follow up prn

## 2016-07-03 ENCOUNTER — Other Ambulatory Visit: Payer: Self-pay | Admitting: Adult Health

## 2016-07-10 ENCOUNTER — Other Ambulatory Visit: Payer: Self-pay | Admitting: Adult Health

## 2016-07-28 ENCOUNTER — Telehealth: Payer: Self-pay | Admitting: Adult Health

## 2016-07-28 MED ORDER — NITROFURANTOIN MONOHYD MACRO 100 MG PO CAPS: 100.0000 mg | ORAL_CAPSULE | Freq: Two times a day (BID) | ORAL | 0 refills | Status: DC

## 2016-07-28 NOTE — Telephone Encounter (Signed)
Has UTI sx, will rx macrobid

## 2016-10-02 ENCOUNTER — Other Ambulatory Visit: Payer: Self-pay | Admitting: Adult Health

## 2016-10-23 ENCOUNTER — Encounter (INDEPENDENT_AMBULATORY_CARE_PROVIDER_SITE_OTHER): Payer: Self-pay

## 2016-10-23 ENCOUNTER — Ambulatory Visit (INDEPENDENT_AMBULATORY_CARE_PROVIDER_SITE_OTHER): Payer: BC Managed Care – PPO | Admitting: Adult Health

## 2016-10-23 ENCOUNTER — Encounter: Payer: Self-pay | Admitting: Adult Health

## 2016-10-23 VITALS — BP 122/60 | HR 90 | Ht 62.0 in | Wt 167.5 lb

## 2016-10-23 DIAGNOSIS — R102 Pelvic and perineal pain: Secondary | ICD-10-CM | POA: Diagnosis not present

## 2016-10-23 DIAGNOSIS — R350 Frequency of micturition: Secondary | ICD-10-CM | POA: Diagnosis not present

## 2016-10-23 DIAGNOSIS — R319 Hematuria, unspecified: Secondary | ICD-10-CM | POA: Diagnosis not present

## 2016-10-23 DIAGNOSIS — Z87442 Personal history of urinary calculi: Secondary | ICD-10-CM

## 2016-10-23 DIAGNOSIS — N390 Urinary tract infection, site not specified: Secondary | ICD-10-CM | POA: Diagnosis not present

## 2016-10-23 LAB — POCT URINALYSIS DIPSTICK: Nitrite, UA: NEGATIVE

## 2016-10-23 MED ORDER — CEFTRIAXONE SODIUM 250 MG IJ SOLR: 250.0000 mg | Freq: Once | INTRAMUSCULAR | Status: DC

## 2016-10-23 MED ORDER — CEFTRIAXONE SODIUM 250 MG IJ SOLR
1.0000 g | Freq: Once | INTRAMUSCULAR | Status: AC
  Administered 2016-10-23: 1 g via INTRAMUSCULAR

## 2016-10-23 MED ORDER — PHENAZOPYRIDINE HCL 200 MG PO TABS: 200.0000 mg | ORAL_TABLET | Freq: Three times a day (TID) | ORAL | 0 refills | Status: DC | PRN

## 2016-10-23 MED ORDER — CEPHALEXIN 500 MG PO CAPS: 500.0000 mg | ORAL_CAPSULE | Freq: Four times a day (QID) | ORAL | 0 refills | Status: DC

## 2016-10-23 NOTE — Addendum Note (Signed)
Addended by: Linton Rump on: 10/23/2016 10:41 AM   Modules accepted: Orders

## 2016-10-23 NOTE — Progress Notes (Signed)
Subjective:     Patient ID: Rebekah Ferrell, female   DOB: [birth year], 51 y.o.   MRN: [id number]  HPI Ludivina is a 51 year old white female, married, worked in for urinary frequency and pelvic pain and burning since this morning and was peeing blood. She has history of kidney stones, but this does not feel like that.   Review of Systems Urinary frequency Pelvic pain and burning since this am Peeing blood. Reviewed past medical,surgical, social and family history. Reviewed medications and allergies.     Objective:   Physical Exam BP 122/60 (BP Location: Left Arm, Patient Position: Sitting, Cuff Size: Normal)   Pulse 90   Ht 5\' 2"  (1.575 m)   Wt 167 lb 8 oz (76 kg)   LMP 11/28/2011   BMI 30.64 kg/m urine dipsticktrace leuks, trace protein,3+blood,3+ketones, and 4+glucose, Skin warm and dry.Pelvic: external genitalia is normal in appearance no lesions, vagina:  Scant white discharge without odor,urethra has no lesions or masses noted, cervix:smooth and bulbous, uterus: normal size, shape and contour, non tender, no masses felt, adnexa: no masses or tenderness noted. Bladder is mildly tender and no masses felt.Mild CVAT bilaterally bur she says she has pain all the time to some degree.    Discussed with Dr.  Elonda Husky and he agrees with my plan of treatment.  Assessment:     1. Urinary frequency   2. Hematuria, unspecified type   3. Pelvic pain   4. History of kidney stones   5. Urinary tract infection with hematuria, site unspecified       Plan:     Rx keflex 500 mg 1 qid x 7 days #28 Rx pyridium 200 mg 310 take 1 tid prn UA C&S sent Rocephin 1 gm IM in office Renal US 6/13 at 2:30 pm at [facility] F/u with me in 3 days Push fluids

## 2016-10-24 LAB — URINALYSIS, ROUTINE W REFLEX MICROSCOPIC
BILIRUBIN UA: NEGATIVE
Nitrite, UA: NEGATIVE
Specific Gravity, UA: >=1.03 — AB (ref 1.005–1.030)
UUROB: 0.2 mg/dL (ref 0.2–1.0)
pH, UA: 5.5 (ref 5.0–7.5)

## 2016-10-24 LAB — MICROSCOPIC EXAMINATION
Bacteria, UA: NONE SEEN
Casts: NONE SEEN /lpf

## 2016-10-25 ENCOUNTER — Ambulatory Visit (HOSPITAL_COMMUNITY)
Admission: RE | Admit: 2016-10-25 | Discharge: 2016-10-25 | Disposition: A | Discharge status: Home / Self Care | Payer: BC Managed Care – PPO | Source: Ambulatory Visit | Attending: Adult Health | Admitting: Adult Health

## 2016-10-25 DIAGNOSIS — K769 Liver disease, unspecified: Secondary | ICD-10-CM | POA: Diagnosis not present

## 2016-10-25 DIAGNOSIS — R102 Pelvic and perineal pain: Secondary | ICD-10-CM

## 2016-10-25 DIAGNOSIS — R350 Frequency of micturition: Secondary | ICD-10-CM

## 2016-10-25 DIAGNOSIS — R319 Hematuria, unspecified: Secondary | ICD-10-CM | POA: Diagnosis present

## 2016-10-26 ENCOUNTER — Encounter: Payer: Self-pay | Admitting: Adult Health

## 2016-10-26 ENCOUNTER — Ambulatory Visit (INDEPENDENT_AMBULATORY_CARE_PROVIDER_SITE_OTHER): Payer: BC Managed Care – PPO | Admitting: Adult Health

## 2016-10-26 VITALS — BP 118/68 | HR 82 | Ht 62.0 in | Wt 167.5 lb

## 2016-10-26 DIAGNOSIS — Z87440 Personal history of urinary (tract) infections: Secondary | ICD-10-CM | POA: Diagnosis not present

## 2016-10-26 DIAGNOSIS — K760 Fatty (change of) liver, not elsewhere classified: Secondary | ICD-10-CM

## 2016-10-26 LAB — URINE CULTURE

## 2016-10-26 NOTE — Progress Notes (Signed)
Subjective:     Patient ID: Rebekah Ferrell, female   DOB: 15-Jul-1965, 51 y.o.   MRN: [id number]  HPI Rebekah Ferrell is a 51 year old white female back in follow up of having pain with UTI, feels much better and to review renal US.   Review of Systems No pain, feels much better Reviewed past medical,surgical, social and family history. Reviewed medications and allergies.     Objective:   Physical Exam BP 118/68 (BP Location: Left Arm, Patient Position: Sitting, Cuff Size: Normal)   Pulse 82   Ht 5\' 2"  (1.575 m)   Wt 167 lb 8 oz (76 kg)   BMI 30.64 kg/m  Skin warm and dry.  Lungs: clear to ausculation bilaterally. Cardiovascular: regular rate and rhythm.No CVAT.Reviewed culture was + E coli and US showed normal kidneys and bladder but fatty liver.Will refer to GI for fatty liver.     Assessment:     1. History of UTI   2. Fatty liver       Plan:     Finish meds Return in 1 week for UA C&S for POT Referred to [facility]

## 2016-11-01 ENCOUNTER — Encounter: Payer: Self-pay | Admitting: Internal Medicine

## 2016-11-01 ENCOUNTER — Encounter: Payer: Self-pay | Admitting: Gastroenterology

## 2016-11-20 ENCOUNTER — Ambulatory Visit (INDEPENDENT_AMBULATORY_CARE_PROVIDER_SITE_OTHER): Payer: BC Managed Care – PPO | Admitting: Adult Health

## 2016-11-20 ENCOUNTER — Encounter: Payer: Self-pay | Admitting: Adult Health

## 2016-11-20 VITALS — BP 104/60 | HR 95 | Ht 62.0 in | Wt 159.0 lb

## 2016-11-20 DIAGNOSIS — R350 Frequency of micturition: Secondary | ICD-10-CM | POA: Diagnosis not present

## 2016-11-20 DIAGNOSIS — N390 Urinary tract infection, site not specified: Secondary | ICD-10-CM | POA: Diagnosis not present

## 2016-11-20 DIAGNOSIS — R319 Hematuria, unspecified: Secondary | ICD-10-CM

## 2016-11-20 DIAGNOSIS — R309 Painful micturition, unspecified: Secondary | ICD-10-CM

## 2016-11-20 DIAGNOSIS — Z87440 Personal history of urinary (tract) infections: Secondary | ICD-10-CM

## 2016-11-20 MED ORDER — PHENAZOPYRIDINE HCL 200 MG PO TABS: 200.0000 mg | ORAL_TABLET | Freq: Three times a day (TID) | ORAL | 1 refills | Status: DC | PRN

## 2016-11-20 MED ORDER — NITROFURANTOIN MONOHYD MACRO 100 MG PO CAPS: 100.0000 mg | ORAL_CAPSULE | Freq: Two times a day (BID) | ORAL | 0 refills | Status: DC

## 2016-11-20 NOTE — Progress Notes (Signed)
Subjective:     Patient ID: Rebekah Ferrell, female   DOB: [birth year], 51 y.o.   MRN: [id number]  HPI Rebekah Ferrell is a 51 year old white female, married, in complaining of pain with urination and frequeuncy started yesterday coming home from the beach,was just treated a month ago for +E COLI in urine with rocephin and keflex, had normal renal US, but she forgot to get F/U UA C&S for proof of treatment.she thinks it may be related to victoza, but it has blood sugars under control and she has lost weight.   Review of Systems +pain with urination Urinary frequency Reviewed past medical,surgical, social and family history. Reviewed medications and allergies.     Objective:   Physical Exam BP 104/60 (BP Location: Right Arm, Patient Position: Sitting, Cuff Size: Normal)   Pulse 95   Ht 5\' 2"  (1.575 m)   Wt 159 lb (72.1 kg)   BMI 29.08 kg/m Urine orange.No odor.   Skin warm and dry, bladder tender, no CVAT  Assessment:     1. Urinary tract infection with hematuria, site unspecified   2. Pain with urination   3. Urinary frequency   4. History of UTI       Plan:     UA C&S sent Meds ordered this encounter  Medications  . nitrofurantoin, macrocrystal-monohydrate, (MACROBID) 100 MG capsule    Sig: Take 1 capsule (100 mg total) by mouth 2 (two) times daily.    Dispense:  14 capsule    Refill:  0    Order Specific Question:   Supervising Provider    Answer:   Elonda Husky, Rebekah Ferrell [[id number]]  . phenazopyridine (PYRIDIUM) 200 MG tablet    Sig: Take 1 tablet (200 mg total) by mouth 3 (three) times daily as needed for pain.    Dispense:  10 tablet    Refill:  1    Order Specific Question:   Supervising Provider    Answer:   Rebekah Ferrell [[id number]]  PUSH fluids Follow up in 2 weeks, will get UA C&S for POT

## 2016-11-21 LAB — URINALYSIS, ROUTINE W REFLEX MICROSCOPIC
Bilirubin, UA: NEGATIVE
Ketones, UA: NEGATIVE
Nitrite, UA: POSITIVE — AB
Specific Gravity, UA: >=1.03 — AB (ref 1.005–1.030)
UUROB: 1 mg/dL (ref 0.2–1.0)
pH, UA: 5 (ref 5.0–7.5)

## 2016-11-21 LAB — MICROSCOPIC EXAMINATION
Casts: NONE SEEN /lpf
Epithelial Cells (non renal): >10 /hpf — AB (ref 0–10)
RBC, UA: >30 /hpf — AB (ref 0–?)
WBC, UA: >30 /hpf — AB (ref 0–?)

## 2016-11-22 LAB — URINE CULTURE

## 2016-11-23 ENCOUNTER — Telehealth: Payer: Self-pay | Admitting: Adult Health

## 2016-11-23 NOTE — Telephone Encounter (Signed)
Spoke with pt advising to finish antibiotic and take Pyridium as prescribed. Culture sensitive to Macrobid. Push fluids. Call if no better after finishing antibiotic. Pt voiced understanding. [place]

## 2016-11-23 NOTE — Telephone Encounter (Signed)
Pt aware +ECOLI and macrobid should take care of it, will get POT

## 2016-11-23 NOTE — Telephone Encounter (Signed)
Spoke with pt. Pt is feeling some better but still having burning with urination and feels like she's not emptying bladder. She is taking Macrobid BID. Please advise. Thanks!! Rinard

## 2016-11-29 ENCOUNTER — Other Ambulatory Visit (INDEPENDENT_AMBULATORY_CARE_PROVIDER_SITE_OTHER): Payer: BC Managed Care – PPO

## 2016-11-29 DIAGNOSIS — Z87440 Personal history of urinary (tract) infections: Secondary | ICD-10-CM

## 2016-11-29 DIAGNOSIS — R309 Painful micturition, unspecified: Secondary | ICD-10-CM

## 2016-11-29 DIAGNOSIS — R350 Frequency of micturition: Secondary | ICD-10-CM | POA: Diagnosis not present

## 2016-11-29 LAB — POCT URINALYSIS DIPSTICK
Glucose, UA: 4
Nitrite, UA: NEGATIVE
Protein, UA: 2
RBC UA: 3

## 2016-11-30 ENCOUNTER — Ambulatory Visit (INDEPENDENT_AMBULATORY_CARE_PROVIDER_SITE_OTHER): Payer: BC Managed Care – PPO | Admitting: Adult Health

## 2016-11-30 ENCOUNTER — Encounter: Payer: Self-pay | Admitting: Adult Health

## 2016-11-30 VITALS — BP 120/80 | HR 80 | Temp 98.1°F | Ht 62.0 in | Wt 158.0 lb

## 2016-11-30 DIAGNOSIS — N390 Urinary tract infection, site not specified: Secondary | ICD-10-CM

## 2016-11-30 DIAGNOSIS — R350 Frequency of micturition: Secondary | ICD-10-CM

## 2016-11-30 DIAGNOSIS — R319 Hematuria, unspecified: Secondary | ICD-10-CM

## 2016-11-30 MED ORDER — URIBEL 118 MG PO CAPS: ORAL_CAPSULE | ORAL | 0 refills | Status: DC

## 2016-11-30 MED ORDER — CEPHALEXIN 500 MG PO CAPS: 500.0000 mg | ORAL_CAPSULE | Freq: Four times a day (QID) | ORAL | 0 refills | Status: DC

## 2016-11-30 MED ORDER — CEFTRIAXONE SODIUM 250 MG IJ SOLR
1000.0000 mg | Freq: Once | INTRAMUSCULAR | Status: AC
  Administered 2016-11-30: 1000 mg via INTRAMUSCULAR

## 2016-11-30 MED ORDER — ONDANSETRON HCL 8 MG PO TABS: 8.0000 mg | ORAL_TABLET | Freq: Three times a day (TID) | ORAL | 0 refills | Status: DC | PRN

## 2016-11-30 NOTE — Progress Notes (Signed)
Pt given Rocephin 1000mg  IM left VG without complications. Pt observed for 15 min with no adverse reactions.

## 2016-11-30 NOTE — Addendum Note (Signed)
Addended by: Gaylyn Rong A on: 11/30/2016 01:53 PM   Modules accepted: Orders

## 2016-11-30 NOTE — Progress Notes (Signed)
Subjective:     Patient ID: Rebekah Ferrell, female   DOB: 02/05/1966, 51 y.o.   MRN: [id number]  HPI Sabriyah is a 51 year old white female back in follow up of having second UTI in about a month, she finished Macrobid and dropped urine off yesterday for POC UA C&S.Having frequency,pressure and burning,and some nausea at times.Urine was positive for E Coli both times.She has been on [place] and stopped [place] this week.[place].She had normal renal US in June.  Review of Systems Urinary frequency  Nausea at times  Has pressure and some burning Reviewed past medical,surgical, social and family history. Reviewed medications and allergies.     Objective:   Physical Exam BP 120/80 (BP Location: Right Arm, Patient Position: Sitting, Cuff Size: Small)   Pulse 80   Temp 98.1 F (36.7 C)   Ht 5\' 2"  (1.575 m)   Wt 158 lb (71.7 kg)   BMI 28.90 kg/m Skin warm and dry, tender over bladder, no CVAT, but feels like pressure radiating down to pubic area.   Discussed with Dr Elonda Husky, and he agrees with Rocephin 1 gm now and Rx keflex again.   Assessment:     1. Urinary tract infection with hematuria, site unspecified   2. Urinary frequency       Plan:    Had urine sent yesterday for UA C&S Meds ordered this encounter  Medications  . cephALEXin (KEFLEX) 500 MG capsule    Sig: Take 1 capsule (500 mg total) by mouth 4 (four) times daily.    Dispense:  28 capsule    Refill:  0    Order Specific Question:   Supervising Provider    Answer:   EURE, LUTHER H [[id number]]  . ondansetron (ZOFRAN) 8 MG tablet    Sig: Take 1 tablet (8 mg total) by mouth every 8 (eight) hours as needed for nausea or vomiting.    Dispense:  20 tablet    Refill:  0    Order Specific Question:   Supervising Provider    Answer:   Elonda Husky, LUTHER H [[id number]]  . Meth-Hyo-M Bl-Na Phos-Ph Sal (URIBEL) 118 MG CAPS    Sig: Take 1 tid prn    Dispense:  60 capsule    Refill:  0    Order  Specific Question:   Supervising Provider    Answer:   Tania Ade H [[id number]]  Received rocephin 1 gm IM in office Follow up in 1 week, if not better may refer to urology

## 2016-12-02 LAB — URINE CULTURE

## 2016-12-07 ENCOUNTER — Ambulatory Visit: Payer: BC Managed Care – PPO | Admitting: Adult Health

## 2016-12-08 ENCOUNTER — Encounter: Payer: Self-pay | Admitting: Adult Health

## 2016-12-08 ENCOUNTER — Ambulatory Visit (INDEPENDENT_AMBULATORY_CARE_PROVIDER_SITE_OTHER): Payer: BC Managed Care – PPO | Admitting: Adult Health

## 2016-12-08 VITALS — BP 118/74 | HR 89 | Ht 62.0 in | Wt 162.5 lb

## 2016-12-08 DIAGNOSIS — R81 Glycosuria: Secondary | ICD-10-CM | POA: Diagnosis not present

## 2016-12-08 DIAGNOSIS — Z87440 Personal history of urinary (tract) infections: Secondary | ICD-10-CM

## 2016-12-08 LAB — POCT URINALYSIS DIPSTICK
Blood, UA: NEGATIVE
KETONES UA: NEGATIVE
LEUKOCYTES UA: NEGATIVE
Nitrite, UA: NEGATIVE
Protein, UA: NEGATIVE

## 2016-12-08 MED ORDER — FLUCONAZOLE 150 MG PO TABS: ORAL_TABLET | ORAL | 1 refills | Status: DC

## 2016-12-08 NOTE — Progress Notes (Signed)
Subjective:     Patient ID: Timoteo Expose, female   DOB: 05-Jun-1965, 51 y.o.   MRN: [id number]  HPI Mitsuko is a 51 year old white female, back in follow up of recent UT and feels much better, still some pressure LLQ.   Review of Systems Pressure LLQ  Vaginal itch about once a month   Reviewed past medical,surgical, social and family history. Reviewed medications and allergies.     Objective:   Physical Exam BP 118/74 (BP Location: Left Arm, Patient Position: Sitting, Cuff Size: Normal)   Pulse 89   Ht 5\' 2"  (1.575 m)   Wt 162 lb 8 oz (73.7 kg)   BMI 29.72 kg/m urine 4+glucose, no tenderness over bladder, some pressure LLQ, will send UA C&S for POC. Will rx diflucan once a month. Will wait for [place] C&S, may get urology consult then.      Assessment:     1. History of UTI   2. Glucosuria       Plan:    Call PCP for appt.  UA C&S sent Meds ordered this encounter  Medications  . fluconazole (DIFLUCAN) 150 MG tablet    Sig: Take 1 monthly    Dispense:  6 tablet    Refill:  1    Order Specific Question:   Supervising Provider    Answer:   Tania Ade H [[id number]]  Follow up prn

## 2016-12-09 LAB — URINALYSIS, ROUTINE W REFLEX MICROSCOPIC
Bilirubin, UA: NEGATIVE
KETONES UA: NEGATIVE
Leukocytes, UA: NEGATIVE
Nitrite, UA: NEGATIVE
Protein, UA: NEGATIVE
RBC, UA: NEGATIVE
Specific Gravity, UA: >=1.03 — AB (ref 1.005–1.030)
Urobilinogen, Ur: 0.2 mg/dL (ref 0.2–1.0)
pH, UA: 5.5 (ref 5.0–7.5)

## 2016-12-11 ENCOUNTER — Telehealth: Payer: Self-pay | Admitting: Adult Health

## 2016-12-11 LAB — URINE CULTURE

## 2016-12-11 MED ORDER — NITROFURANTOIN MONOHYD MACRO 100 MG PO CAPS: ORAL_CAPSULE | ORAL | 3 refills | Status: DC

## 2016-12-11 NOTE — Telephone Encounter (Signed)
Pt aware that culture + enterococcus, will rx Macrobid 1 bid x 7 days then 1 daily at hs and recheck culture in 1 month.

## 2016-12-27 ENCOUNTER — Encounter: Payer: Self-pay | Admitting: Gastroenterology

## 2016-12-27 ENCOUNTER — Ambulatory Visit (INDEPENDENT_AMBULATORY_CARE_PROVIDER_SITE_OTHER): Payer: BC Managed Care – PPO | Admitting: Gastroenterology

## 2016-12-27 VITALS — BP 122/74 | HR 88 | Temp 97.1°F | Ht 62.0 in | Wt 161.8 lb

## 2016-12-27 DIAGNOSIS — K760 Fatty (change of) liver, not elsewhere classified: Secondary | ICD-10-CM | POA: Diagnosis not present

## 2016-12-27 NOTE — Progress Notes (Signed)
Primary Care Physician:  Scherrie Bateman Primary Gastroenterologist:  Dr. Gala Romney   Chief Complaint  Patient presents with  . fatty liver    HPI:   Rebekah Ferrell is a 51 y.o. female presenting today at the request of Derrek Monaco, NP, secondary to fatty liver. She had a renal ultrasound recently that showed probable fatty infiltrative change of the liver.   Has been told her liver numbers have been elevated over past few years but no evaluation thus far. States she was told she had hepatitis in high school. States sometimes she has a little soreness in her abdomen but doesn't pay attention to it. Has been sick since June with a UTI. Now on a maintenance course of antibiotics for about 3 weeks. No significant bowel changes. Diagnosed with gastroparesis many years ago by endocrinology. Describes a gastric emptying. Has zofran on hand if needed but doing well. No rectal bleeding. No prior colonoscopy. Not interested in colonoscopy just this second. Episodes of tachycardia. Has had electrophysiology mapping and ablation in past.   Outside labs from May 2018 with ALT 49, AST 29, Alk PHos 87, Tbili 0.4, platelets 325, Hgb 13.4. Feb 2018 labs with Alk Phos 93, AST 19, ALT 28 ( normal).   2015, AST 38, ALT 80.   Past Medical History:  Diagnosis Date  . Anxiety   . Cancer ([place])    skin   . Chronic kidney disease    kidney stone  . Diabetes mellitus without complication ([place])   . Hormone replacement therapy (HRT) 11/18/2013  . Hot flashes 07/15/2013  . Hypertension   . Menopause 09/29/2013  . Psoriatic arthritis ([place])   . Spongiotic psoriasiform dermatitis   . SUI (stress urinary incontinence, female)     Past Surgical History:  Procedure Laterality Date  . CARDIAC ELECTROPHYSIOLOGY MAPPING AND ABLATION    . CHOLECYSTECTOMY    . ENDOMETRIAL ABLATION    . TUBAL LIGATION      Current Outpatient Prescriptions  Medication Sig Dispense Refill  . ALPRAZolam (XANAX) 0.5  MG tablet TAKE 1 TABLET BY MOUTH 3 TIMES A DAY AS NEEDED FOR ANXIETY. 90 tablet 1  . aspirin 81 MG tablet Take 81 mg by mouth daily.    . Biotin 5000 MCG CAPS Take 1 capsule by mouth daily.    . Cholecalciferol (VITAMIN D3) 2000 units TABS Take by mouth daily.     Marland Kitchen EPINEPHrine 0.3 mg/0.3 mL IJ SOAJ injection Inject once into the muscle as needed for severe allergic reaction or anaphylaxis 1 Device 0  . fluconazole (DIFLUCAN) 150 MG tablet Take 1 monthly 6 tablet 1  . Fluocinonide 0.1 % CREA as needed.     . Ixekizumab (TALTZ [place]) Inject into the skin every 30 (thirty) days.     Marland Kitchen lisinopril (PRINIVIL,ZESTRIL) 2.5 MG tablet Take 2.5 mg by mouth daily.     Marland Kitchen loratadine (CLARITIN) 10 MG tablet Take 10 mg by mouth daily.    . metFORMIN (GLUCOPHAGE-XR) 500 MG 24 hr tablet Take 1,000 mg by mouth 2 (two) times daily.    . naproxen (NAPROSYN) 500 MG tablet Take 500 mg by mouth 2 (two) times daily with a meal.     . nitrofurantoin, macrocrystal-monohydrate, (MACROBID) 100 MG capsule Take 1 bid x 7 days then 1 daily at bedtime 30 capsule 3  . oxiconazole (OXISTAT) 1 % CREA as needed.     Marland Kitchen VICTOZA 18 MG/3ML SOPN daily.     Marland Kitchen  sertraline (ZOLOFT) 25 MG tablet TAKE 3 TABLETs BY MOUTH ONCE DAILY. (Patient not taking: Reported on 12/27/2016) 90 tablet 3  . SYNJARDY XR 12.09-998 MG TB24 2 (two) times daily.      No current facility-administered medications for this visit.     Allergies as of 12/27/2016 - Review Complete 12/27/2016  Allergen Reaction Noted  . Benadryl [diphenhydramine] Other (See Comments) 01/05/2016  . Levaquin [levofloxacin in d5w] Swelling 07/15/2013  . Mold extract [trichophyton] Hives and Swelling 01/05/2016  . Sulfa antibiotics Hives 07/15/2013    Family History  Problem Relation Age of Onset  . Hypertension Mother   . Brain cancer Mother   . Diabetes Father   . Hypertension Father   . Cancer Father        bladder  . Diabetes Sister   . Cancer Maternal Grandmother         breast  . Stroke Maternal Grandmother     Social History   Social History  . Marital status: Married    Spouse name: N/A  . Number of children: N/A  . Years of education: N/A   Occupational History  . Not on file.   Social History Main Topics  . Smoking status: Former Smoker    Types: Cigarettes  . Smokeless tobacco: Never Used  . Alcohol use No  . Drug use: No  . Sexual activity: Yes    Birth control/ protection: Surgical     Comment: tubal and ablation   Other Topics Concern  . Not on file   Social History Narrative  . No narrative on file    Review of Systems: As mentioned in HPI   Physical Exam: BP 122/74   Pulse 88   Temp (!) 97.1 F (36.2 C) (Oral)   Ht _0  (1.575 m)   Wt 161 lb 12.8 oz (73.4 kg)   BMI 29.59 kg/m  General:   Alert and oriented. Pleasant and cooperative. Well-nourished and well-developed.  Head:  Normocephalic and atraumatic. Eyes:  Without icterus, sclera clear and conjunctiva pink.  Ears:  Normal auditory acuity. Nose:  No deformity, discharge,  or lesions. Mouth:  No deformity or lesions, oral mucosa pink.  Lungs:  Clear to auscultation bilaterally. No wheezes, rales, or rhonchi. No distress.  Heart:  S1, S2 present without murmurs appreciated.  Abdomen:  +BS, soft, non-tender and non-distended. No HSM noted. No guarding or rebound. No masses appreciated.  Rectal:  Deferred  Msk:  Symmetrical without gross deformities. Normal posture. Extremities:  Without edema. Neurologic:  Alert and  oriented x4 Skin:  Intact without significant lesions or rashes. Psych:  Alert and cooperative. Normal mood and affect.

## 2016-12-27 NOTE — Patient Instructions (Signed)
I will get the blood work and review it. Then, we will order the labs that are needed and the ultrasound.  I will see you in 3 months.   Call me if any issues!

## 2016-12-29 ENCOUNTER — Telehealth: Payer: Self-pay | Admitting: Gastroenterology

## 2016-12-29 NOTE — Telephone Encounter (Signed)
Please arrange a RUQ ultrasound for patient, diagnosis fatty liver. I have already put in the orders.  She will also need blood work done when she completes ultrasound. I have ordered these as well and informed patient we would be calling next week.

## 2016-12-29 NOTE — Assessment & Plan Note (Signed)
51 year old very pleasant female with reported history of elevated LFTs in past, with outside labs from several months ago revealing just mildly elevated ALT at 49, and prior evaluation in Feb 2018 normal. Labs in epic reviewed from 2015 with ALT elevated at 80, other transaminases normal. Renal ultrasound incidentally reports probably fatty liver. She reports a history of "hepatitis" in high school.   Will pursue routine US abdomen for better imaging of liver. Check updated HFP, Hep B surface antigen, Hep B core antibody, Hep B surface antibody, Hep A total antibody, Hep C antibody, AMA, ANA, ASMA, immunoglobulins, ceruloplasmin, alpha-1 antitrypsin phenotype, celiac serologies, as there have been mildly elevated LFTs over past several years, chronic. I highly suspect this is secondary to fatty liver. Return in 3 months.

## 2017-01-01 NOTE — Telephone Encounter (Signed)
[place] abd RUQ scheduled for 01/03/17 at 7:30am, pt to arrive at 7:15am. NPO after midnight. Called and informed pt. However, she has a meeting that morning and asked if she could reschedule [place]. Gave her the phone number to Central Scheduling so she can reschedule. She is aware to have lab work done at [facility].

## 2017-01-01 NOTE — Progress Notes (Signed)
CC'ED TO PCP 

## 2017-01-03 ENCOUNTER — Ambulatory Visit (HOSPITAL_COMMUNITY): Admission: RE | Admit: 2017-01-03 | Payer: BC Managed Care – PPO | Source: Ambulatory Visit

## 2017-01-04 ENCOUNTER — Other Ambulatory Visit: Payer: BC Managed Care – PPO

## 2017-01-04 ENCOUNTER — Ambulatory Visit (HOSPITAL_COMMUNITY)
Admission: RE | Admit: 2017-01-04 | Discharge: 2017-01-04 | Disposition: A | Discharge status: Home / Self Care | Payer: BC Managed Care – PPO | Source: Ambulatory Visit | Attending: Gastroenterology | Admitting: Gastroenterology

## 2017-01-04 DIAGNOSIS — Z9049 Acquired absence of other specified parts of digestive tract: Secondary | ICD-10-CM | POA: Insufficient documentation

## 2017-01-04 DIAGNOSIS — Z87440 Personal history of urinary (tract) infections: Secondary | ICD-10-CM

## 2017-01-04 DIAGNOSIS — R932 Abnormal findings on diagnostic imaging of liver and biliary tract: Secondary | ICD-10-CM | POA: Diagnosis not present

## 2017-01-04 DIAGNOSIS — K760 Fatty (change of) liver, not elsewhere classified: Secondary | ICD-10-CM | POA: Diagnosis not present

## 2017-01-04 LAB — HEPATITIS C ANTIBODY: HCV Ab: NONREACTIVE

## 2017-01-04 LAB — HEPATIC FUNCTION PANEL
ALT: 34 U/L — ABNORMAL HIGH (ref 6–29)
AST: 21 U/L (ref 10–35)
Albumin: 4.3 g/dL (ref 3.6–5.1)
Alkaline Phosphatase: 89 U/L (ref 33–130)
Bilirubin, Direct: 0.1 mg/dL (ref <=0.2)
Indirect Bilirubin: 0.3 mg/dL (ref 0.2–1.2)
Total Bilirubin: 0.4 mg/dL (ref 0.2–1.2)
Total Protein: 7.3 g/dL (ref 6.1–8.1)

## 2017-01-04 LAB — HEPATITIS B SURFACE ANTIBODY,QUALITATIVE: Hep B S Ab: NONREACTIVE

## 2017-01-04 LAB — HEPATITIS B SURFACE ANTIGEN: Hepatitis B Surface Ag: NONREACTIVE

## 2017-01-04 LAB — HEPATITIS A ANTIBODY, TOTAL: Hep A Total Ab: NONREACTIVE

## 2017-01-04 LAB — HEPATITIS B CORE ANTIBODY, TOTAL: HEP B C TOTAL AB: NONREACTIVE

## 2017-01-05 LAB — URINALYSIS, ROUTINE W REFLEX MICROSCOPIC
BILIRUBIN UA: NEGATIVE
GLUCOSE, UA: NEGATIVE
KETONES UA: NEGATIVE
Leukocytes, UA: NEGATIVE
NITRITE UA: NEGATIVE
Protein, UA: NEGATIVE
RBC UA: NEGATIVE
SPEC GRAV UA: 1.019 (ref 1.005–1.030)
UUROB: 0.2 mg/dL (ref 0.2–1.0)
pH, UA: 5 (ref 5.0–7.5)

## 2017-01-05 LAB — IGG, IGA, IGM
IGM, SERUM: 91 mg/dL (ref 48–271)
IgA: 655 mg/dL — ABNORMAL HIGH (ref 81–463)
IgG (Immunoglobin G), Serum: 1234 mg/dL (ref 694–1618)

## 2017-01-05 LAB — ANTI-SMOOTH MUSCLE ANTIBODY, IGG

## 2017-01-05 LAB — TISSUE TRANSGLUTAMINASE, IGA: Tissue Transglutaminase Ab, IgA: 1 U/mL (ref <4)

## 2017-01-05 LAB — ANA: Anti Nuclear Antibody(ANA): NEGATIVE

## 2017-01-06 LAB — URINE CULTURE

## 2017-01-08 LAB — CERULOPLASMIN: CERULOPLASMIN: 25 mg/dL (ref 18–53)

## 2017-01-08 LAB — ALPHA-1 ANTITRYPSIN PHENOTYPE: A1 ANTITRYPSIN: 141 mg/dL (ref 83–199)

## 2017-01-08 LAB — MITOCHONDRIAL ANTIBODIES

## 2017-01-08 NOTE — Progress Notes (Signed)
US liver with fatty liver, mild prominence of CBD noted, which is stable over past 5 years. This is not uncommon after cholecystectomy. Bilirubin normal. LFTs much improved. Awaiting full serologies to be completed but thus far, appears just dealing with fatty liver.

## 2017-01-08 NOTE — Progress Notes (Signed)
Routing to [facility], NP, as Rebekah Ferrell

## 2017-01-09 ENCOUNTER — Telehealth: Payer: Self-pay | Admitting: Adult Health

## 2017-01-09 MED ORDER — FLUCONAZOLE 150 MG PO TABS: ORAL_TABLET | ORAL | 1 refills | Status: DC

## 2017-01-09 MED ORDER — AMPICILLIN 500 MG PO CAPS: 500.0000 mg | ORAL_CAPSULE | Freq: Four times a day (QID) | ORAL | 0 refills | Status: DC

## 2017-01-09 NOTE — Telephone Encounter (Signed)
Pt aware urine culture for POC +Ecoli, will rx ampicillin 500 mg qid for 7 days, then POC and resume macrobid and refer to [facility], Dr Alyson Ingles.

## 2017-01-11 NOTE — Progress Notes (Signed)
Ceruloplasmin is greater than 20, AMA, ANA, ASMA negative. Normal celiac serologies. Hep B surface antigen and Hep C negative. IgA elevated, likely due to her history of psoriatic arthritis. Needs Hep A and B vaccinations at some point. LFTs almost completely normalized. Things look good. Return as planned. Dealing with fatty liver.

## 2017-02-12 ENCOUNTER — Other Ambulatory Visit: Payer: Self-pay | Admitting: Adult Health

## 2017-02-14 ENCOUNTER — Other Ambulatory Visit: Payer: Self-pay | Admitting: Adult Health

## 2017-02-28 ENCOUNTER — Other Ambulatory Visit: Payer: BC Managed Care – PPO

## 2017-02-28 ENCOUNTER — Encounter (INDEPENDENT_AMBULATORY_CARE_PROVIDER_SITE_OTHER): Payer: Self-pay

## 2017-02-28 DIAGNOSIS — Z87440 Personal history of urinary (tract) infections: Secondary | ICD-10-CM

## 2017-02-28 NOTE — Progress Notes (Signed)
Pt had dropped off urine so I sent for UA and culture for history of UTI. JSY

## 2017-03-01 LAB — URINALYSIS, ROUTINE W REFLEX MICROSCOPIC
BILIRUBIN UA: NEGATIVE
KETONES UA: NEGATIVE
Leukocytes, UA: NEGATIVE
NITRITE UA: NEGATIVE
Protein, UA: NEGATIVE
RBC UA: NEGATIVE
UUROB: 0.2 mg/dL (ref 0.2–1.0)
pH, UA: 5 (ref 5.0–7.5)

## 2017-03-04 LAB — URINE CULTURE

## 2017-03-05 ENCOUNTER — Telehealth: Payer: Self-pay | Admitting: Adult Health

## 2017-03-05 MED ORDER — AMPICILLIN 500 MG PO CAPS: 500.0000 mg | ORAL_CAPSULE | Freq: Four times a day (QID) | ORAL | 0 refills | Status: DC

## 2017-03-05 MED ORDER — NITROFURANTOIN MONOHYD MACRO 100 MG PO CAPS: ORAL_CAPSULE | ORAL | 3 refills | Status: DC

## 2017-03-05 NOTE — Telephone Encounter (Signed)
Pt aware of urine and that rx sent to [facility] keep appt with urology

## 2017-03-21 ENCOUNTER — Encounter: Payer: Self-pay | Admitting: Gastroenterology

## 2017-03-21 ENCOUNTER — Ambulatory Visit: Payer: BC Managed Care – PPO | Admitting: Gastroenterology

## 2017-03-21 ENCOUNTER — Telehealth: Payer: Self-pay | Admitting: Gastroenterology

## 2017-03-21 VITALS — BP 111/77 | HR 91 | Temp 97.8°F | Ht 62.0 in | Wt 161.0 lb

## 2017-03-21 DIAGNOSIS — K760 Fatty (change of) liver, not elsewhere classified: Secondary | ICD-10-CM

## 2017-03-21 DIAGNOSIS — K625 Hemorrhage of anus and rectum: Secondary | ICD-10-CM | POA: Diagnosis not present

## 2017-03-21 MED ORDER — HYDROCORTISONE ACETATE 25 MG RE SUPP: 25.0000 mg | Freq: Two times a day (BID) | RECTAL | 1 refills | Status: DC

## 2017-03-21 NOTE — Telephone Encounter (Signed)
Please patient know at some point she should have Hepatitis A and B vaccination. Can get through PCP. I forgot to address this with her at the visit.

## 2017-03-21 NOTE — Assessment & Plan Note (Signed)
Isolated episode, likely benign etiology. She has never had a colonoscopy, and we had discussed this at last visit. Providing anusol suppository. Having psoriatic flare and not desirous of pursuing colon prep at the moment but will call when she is able. Hopefully, we can triage at that time. I feel she would be appropriate for conscious sedation and augmenting with Phenergan.

## 2017-03-21 NOTE — Patient Instructions (Signed)
I sent the Anusol suppositories to your pharmacy to use per rectum twice a day for 7 days, then give it a break. You can repeat if needed.   Please call when you are ready to pursue a colonoscopy!  We will see you in 1 year. Please have Aldona Bar send a copy of your liver numbers when it's checked in several months.

## 2017-03-21 NOTE — Progress Notes (Signed)
cc'ed to pcp °

## 2017-03-21 NOTE — Progress Notes (Signed)
  Referring Provider:  Jennifer Griffin, NP Primary Care Physician:  Jackson, Samantha J, PA-C Primary GI: Dr. Rourk   Chief Complaint  Patient presents with  . fatty liver    f/u, doing ok    HPI:   Rebekah Ferrell is a 51 y.o. female presenting today with a history of fatty liver. Self-reported history of gastroparesis doing clinically well. No prior colonoscopy. Outside labs from May 2018 with ALT 49, AST 29, Alk PHos 87, Tbili 0.4, platelets 325, Hgb 13.4. Feb 2018 labs with Alk Phos 93, AST 19, ALT 28 ( normal). 2015, AST 38, ALT 80. Further serologic evaluation with following: Ceruloplasmin is greater than 20, AMA, ANA, ASMA negative. Normal celiac serologies. Hep B surface antigen and Hep C negative. IgA elevated, likely due to her history of psoriatic arthritis. Needs Hep A and B vaccinations at some point. Most recent LFTs in Aug 2018 with almost normalized transaminases.   She states psoriasis is acting up. Red patches/scaly around neck and ears. States she also has issues with any sensitive tissue areas and this includes near her rectum but not actual rectum. Skin has been sloughing, scaly. She will be calling her rheumatologist. She notes that she had a few incidences of rectal bleeding, painless. No prior colonoscopy. Otherwise, doing well. She is not able to proceed with colonoscopy right now due to psoriasis flare, but she is interested in the future.   Past Medical History:  Diagnosis Date  . Anxiety   . Cancer (HCC)    skin   . Chronic kidney disease    kidney stone  . Diabetes mellitus without complication (HCC)   . Hormone replacement therapy (HRT) 11/18/2013  . Hot flashes 07/15/2013  . Hypertension   . Menopause 09/29/2013  . Psoriatic arthritis (HCC)   . Spongiotic psoriasiform dermatitis   . SUI (stress urinary incontinence, female)   . Tachycardia     Past Surgical History:  Procedure Laterality Date  . CARDIAC ELECTROPHYSIOLOGY MAPPING AND ABLATION    .  CHOLECYSTECTOMY    . ENDOMETRIAL ABLATION    . TUBAL LIGATION      Current Outpatient Medications  Medication Sig Dispense Refill  . ALPRAZolam (XANAX) 0.5 MG tablet TAKE 1 TABLET BY MOUTH 3 TIMES A DAY AS NEEDED FOR ANXIETY. 90 tablet 1  . aspirin 81 MG tablet Take 81 mg by mouth daily.    . Biotin 5000 MCG CAPS Take 1 capsule by mouth daily.    . Cholecalciferol (VITAMIN D3) 2000 units TABS Take by mouth daily.     . COSENTYX SENSOREADY 300 DOSE 150 MG/ML SOAJ Inject 1 Dose once a week into the skin.    . EPINEPHrine 0.3 mg/0.3 mL IJ SOAJ injection Inject once into the muscle as needed for severe allergic reaction or anaphylaxis 1 Device 0  . fluconazole (DIFLUCAN) 150 MG tablet Take 1 monthly 6 tablet 1  . Fluocinonide 0.1 % CREA as needed.     . lisinopril (PRINIVIL,ZESTRIL) 2.5 MG tablet Take 2.5 mg by mouth daily.     . loratadine (CLARITIN) 10 MG tablet Take 10 mg by mouth daily.    . metFORMIN (GLUCOPHAGE-XR) 500 MG 24 hr tablet Take 1,000 mg by mouth 2 (two) times daily.    . naproxen (NAPROSYN) 500 MG tablet Take 500 mg by mouth 2 (two) times daily with a meal.     . oxiconazole (OXISTAT) 1 % CREA as needed.     . sertraline (ZOLOFT)   50 MG tablet TAKE ONE TABLET BY MOUTH ONCE DAILY. 90 tablet 3  . VICTOZA 18 MG/3ML SOPN daily.     . hydrocortisone (ANUSOL-HC) 25 MG suppository Place 1 suppository (25 mg total) 2 (two) times daily rectally. 28 suppository 1   No current facility-administered medications for this visit.     Allergies as of 03/21/2017 - Review Complete 03/21/2017  Allergen Reaction Noted  . Benadryl [diphenhydramine] Other (See Comments) 01/05/2016  . Levaquin [levofloxacin in d5w] Swelling 07/15/2013  . Mold extract [trichophyton] Hives and Swelling 01/05/2016  . Sulfa antibiotics Hives 07/15/2013    Family History  Problem Relation Age of Onset  . Hypertension Mother   . Brain cancer Mother   . Diabetes Father   . Hypertension Father   . Cancer  Father        bladder  . Diabetes Sister   . Cancer Maternal Grandmother        breast  . Stroke Maternal Grandmother   . Colon polyps Neg Hx   . Colon cancer Neg Hx     Social History   Socioeconomic History  . Marital status: Married    Spouse name: None  . Number of children: None  . Years of education: None  . Highest education level: None  Social Needs  . Financial resource strain: None  . Food insecurity - worry: None  . Food insecurity - inability: None  . Transportation needs - medical: None  . Transportation needs - non-medical: None  Occupational History  . Occupation: Therapist, music and IE   Tobacco Use  . Smoking status: Former Smoker    Types: Cigarettes  . Smokeless tobacco: Never Used  . Tobacco comment: quit in late 20s   Substance and Sexual Activity  . Alcohol use: No  . Drug use: No  . Sexual activity: Yes    Birth control/protection: Surgical    Comment: tubal and ablation  Other Topics Concern  . None  Social History Narrative  . None    Review of Systems: As mentioned in HPI   Physical Exam: BP 111/77   Pulse 91   Temp 97.8 F (36.6 C) (Oral)   Ht 5' 2" (1.575 m)   Wt 161 lb (73 kg)   BMI 29.45 kg/m  General:   Alert and oriented. No distress noted. Pleasant and cooperative.  Head:  Normocephalic and atraumatic. Eyes:  Conjuctiva clear without scleral icterus. Mouth:  Oral mucosa pink and moist.  Abdomen:  +BS, soft, non-tender and non-distended. No rebound or guarding. No HSM or masses noted. Msk:  Symmetrical without gross deformities. Normal posture. Extremities:  Without edema. Neurologic:  Alert and  oriented x4 Psych:  Alert and cooperative. Normal mood and affect.

## 2017-03-21 NOTE — Assessment & Plan Note (Signed)
51 year old female with historically mildly elevated LFTs in setting of fatty liver. Thorough evaluation completed thus far. Most recent LFTs near normal. Needs Hep A and B vaccination at some point. Will have repeat HFP through PCP in next few months. Recommend checking every 6 months. Return in 1 year.

## 2017-03-22 NOTE — Telephone Encounter (Signed)
I mailed a letter with the info and also wrote the Rx for the vaccinations if her PCP does not do them and she can take to [facility].

## 2017-03-22 NOTE — Telephone Encounter (Signed)
Tried to call. Many rings and no answer.  

## 2017-05-16 ENCOUNTER — Other Ambulatory Visit: Payer: Self-pay | Admitting: Adult Health

## 2017-07-16 ENCOUNTER — Other Ambulatory Visit: Payer: Self-pay | Admitting: Adult Health

## 2017-08-16 ENCOUNTER — Other Ambulatory Visit: Payer: Self-pay | Admitting: Adult Health

## 2017-10-02 ENCOUNTER — Encounter: Payer: Self-pay | Admitting: Adult Health

## 2017-10-02 ENCOUNTER — Other Ambulatory Visit: Payer: Self-pay | Admitting: Adult Health

## 2017-10-02 MED ORDER — NITROFURANTOIN MONOHYD MACRO 100 MG PO CAPS: 100.0000 mg | ORAL_CAPSULE | Freq: Two times a day (BID) | ORAL | 0 refills | Status: AC

## 2017-10-02 NOTE — Progress Notes (Signed)
Has UTI symptoms, will rx marcobid

## 2017-10-16 ENCOUNTER — Telehealth: Payer: Self-pay | Admitting: Adult Health

## 2017-10-16 ENCOUNTER — Other Ambulatory Visit (INDEPENDENT_AMBULATORY_CARE_PROVIDER_SITE_OTHER): Payer: BC Managed Care – PPO

## 2017-10-16 DIAGNOSIS — R300 Dysuria: Secondary | ICD-10-CM

## 2017-10-16 LAB — POCT URINALYSIS DIPSTICK
Blood, UA: NEGATIVE
GLUCOSE UA: NEGATIVE
Leukocytes, UA: NEGATIVE
Nitrite, UA: NEGATIVE
Protein, UA: NEGATIVE

## 2017-10-16 NOTE — Telephone Encounter (Signed)
Will send urine for for culture

## 2017-10-17 LAB — URINALYSIS, ROUTINE W REFLEX MICROSCOPIC
Bilirubin, UA: NEGATIVE
Glucose, UA: NEGATIVE
KETONES UA: NEGATIVE
Leukocytes, UA: NEGATIVE
NITRITE UA: NEGATIVE
PROTEIN UA: NEGATIVE
RBC, UA: NEGATIVE
Specific Gravity, UA: 1.022 (ref 1.005–1.030)
Urobilinogen, Ur: 0.2 mg/dL (ref 0.2–1.0)
pH, UA: 5 (ref 5.0–7.5)

## 2017-10-18 LAB — URINE CULTURE

## 2017-10-26 ENCOUNTER — Ambulatory Visit: Payer: BC Managed Care – PPO | Admitting: Adult Health

## 2018-02-14 ENCOUNTER — Other Ambulatory Visit: Payer: Self-pay | Admitting: Adult Health

## 2018-02-25 ENCOUNTER — Encounter: Payer: Self-pay | Admitting: Internal Medicine

## 2018-04-17 ENCOUNTER — Other Ambulatory Visit: Payer: Self-pay | Admitting: Adult Health

## 2018-07-15 ENCOUNTER — Other Ambulatory Visit: Payer: Self-pay | Admitting: Adult Health

## 2018-09-25 ENCOUNTER — Other Ambulatory Visit (HOSPITAL_COMMUNITY): Payer: Self-pay | Admitting: Obstetrics and Gynecology

## 2018-09-25 DIAGNOSIS — Z1231 Encounter for screening mammogram for malignant neoplasm of breast: Secondary | ICD-10-CM

## 2018-10-08 ENCOUNTER — Other Ambulatory Visit: Payer: Self-pay | Admitting: Adult Health

## 2019-01-01 ENCOUNTER — Ambulatory Visit (HOSPITAL_COMMUNITY)
Admission: RE | Admit: 2019-01-01 | Discharge: 2019-01-01 | Disposition: A | Discharge status: Home / Self Care | Payer: BC Managed Care – PPO | Source: Ambulatory Visit | Attending: Obstetrics and Gynecology | Admitting: Obstetrics and Gynecology

## 2019-01-01 ENCOUNTER — Other Ambulatory Visit: Payer: Self-pay

## 2019-01-01 DIAGNOSIS — Z1231 Encounter for screening mammogram for malignant neoplasm of breast: Secondary | ICD-10-CM | POA: Diagnosis not present

## 2019-01-24 IMAGING — US US ABDOMEN LIMITED
1 series · 14 of 25 positions shown · non-contrast
Comparison: Ultrasound 10/25/2016.  CT 11/23/2011

CLINICAL DATA: Abnormal labs.  Fatty liver.

EXAM:
ULTRASOUND ABDOMEN LIMITED RIGHT UPPER QUADRANT

[Series 1: us abdomen limited · 0.21mm/px · 14 of 37 slices shown]
[im 1/37]
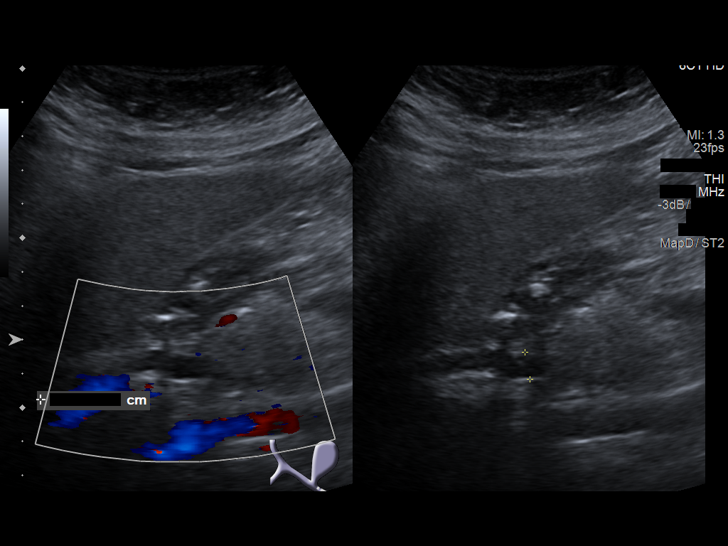
[im 4/37]
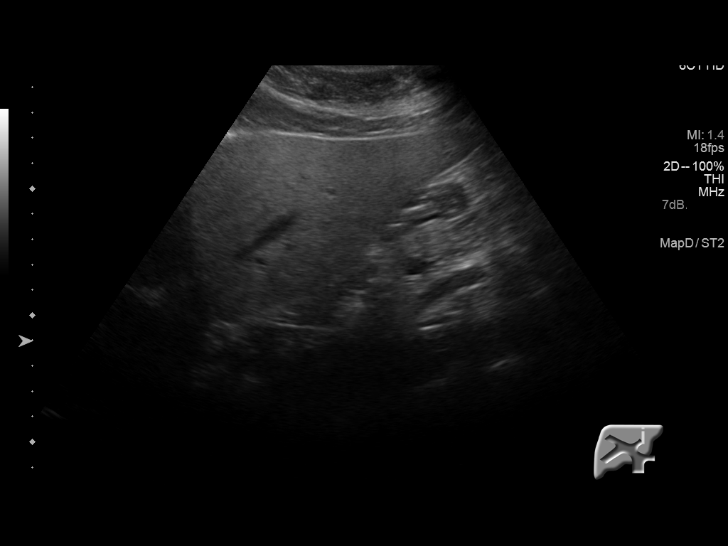
[im 7/37]
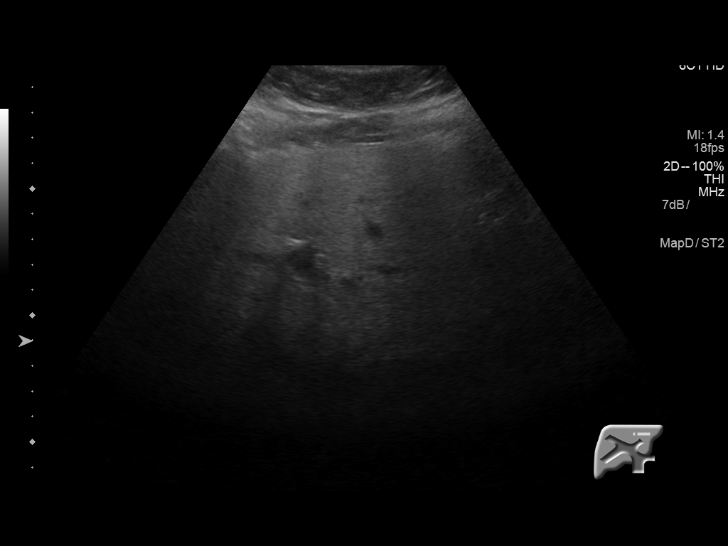
[im 10/37]
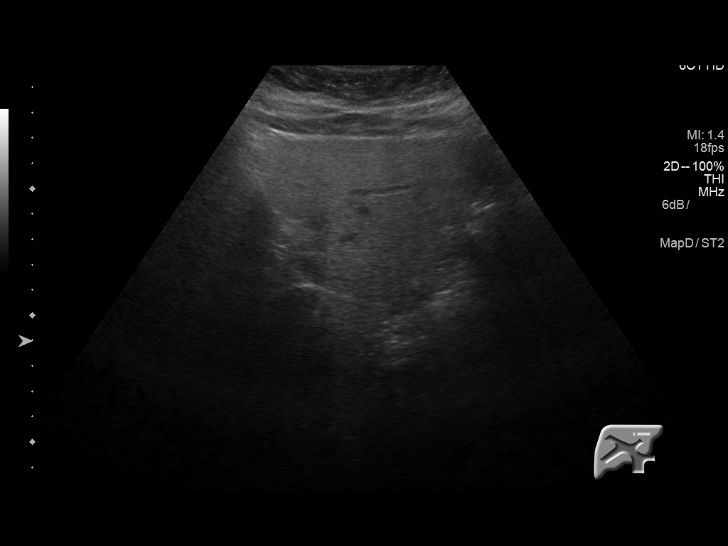
[im 13/37]
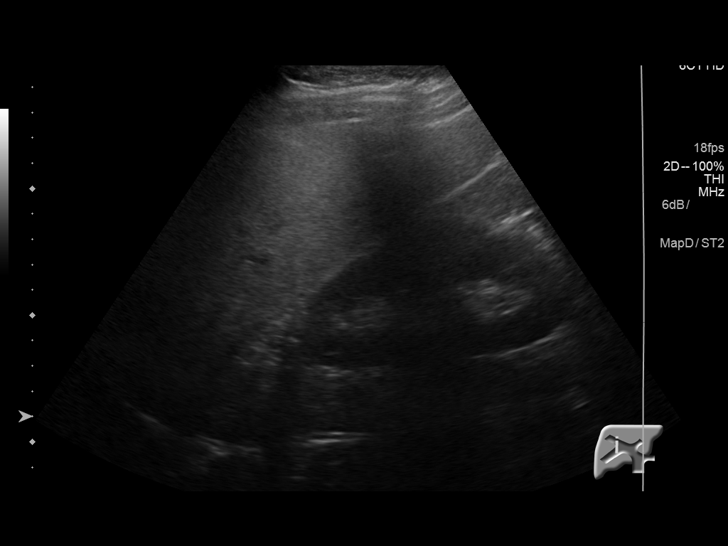
[im 14/37]
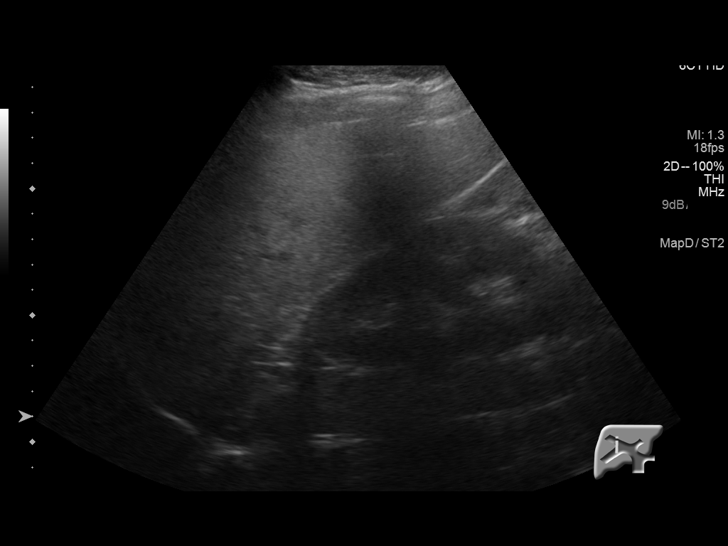
[im 17/37]
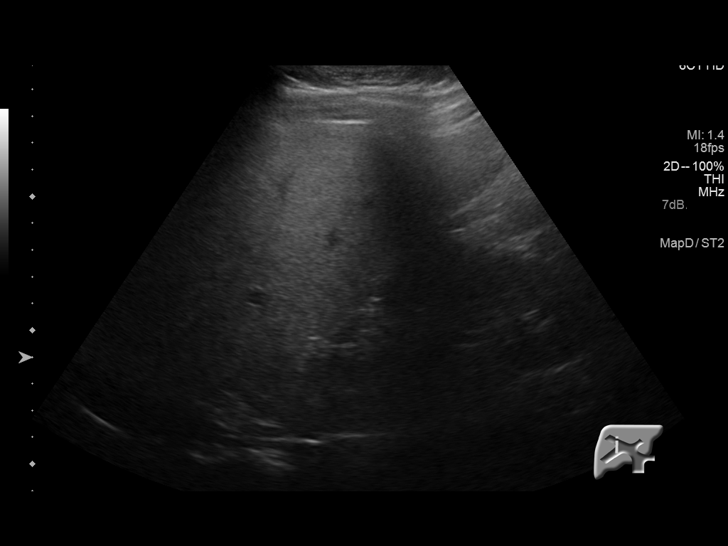
[im 20/37]
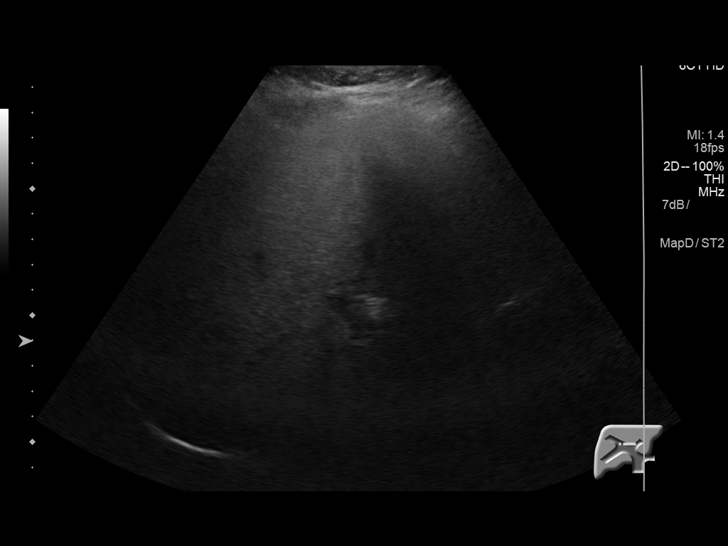
[im 23/37]
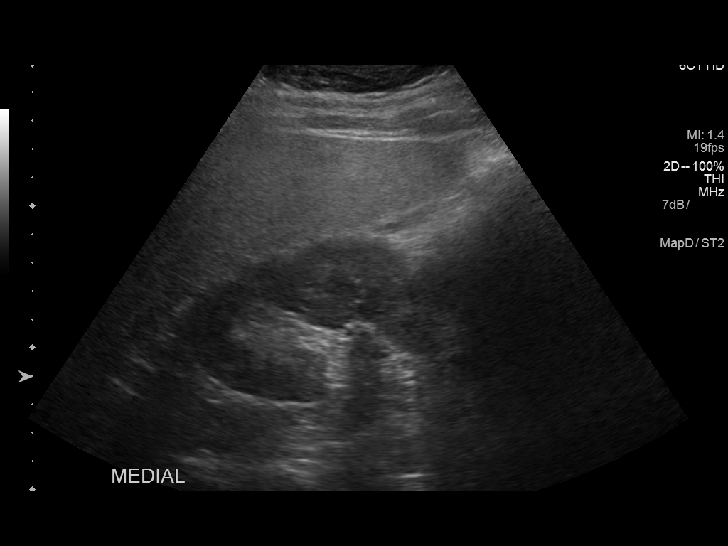
[im 25/37]
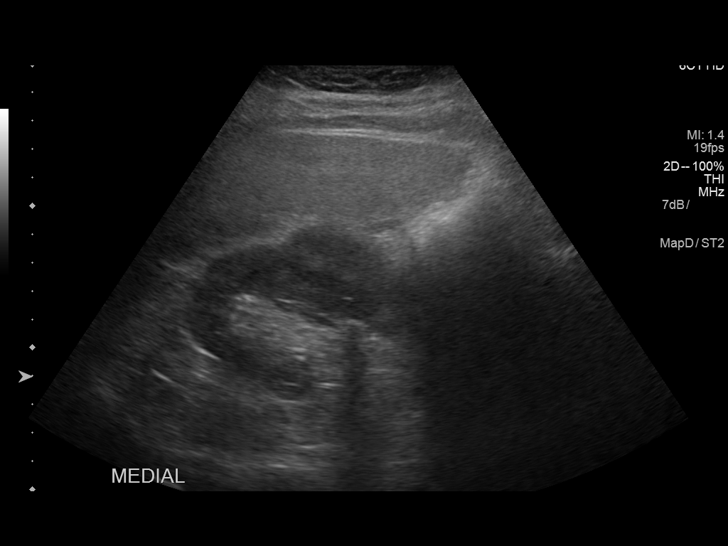
[im 28/37]
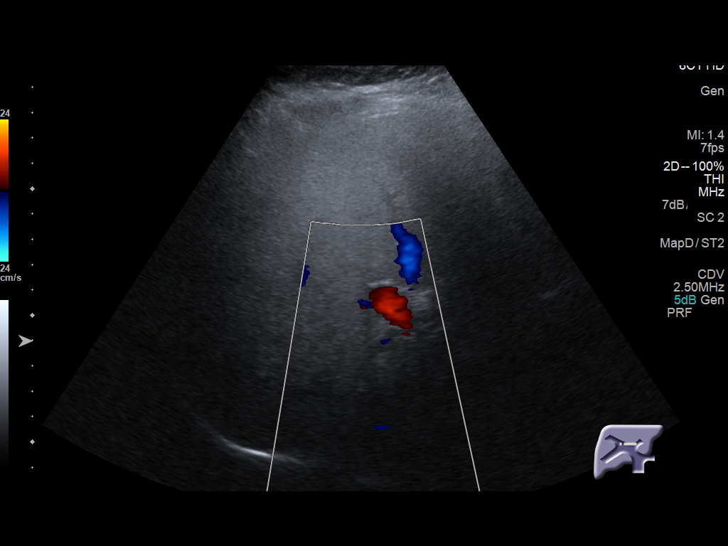
[im 31/37]
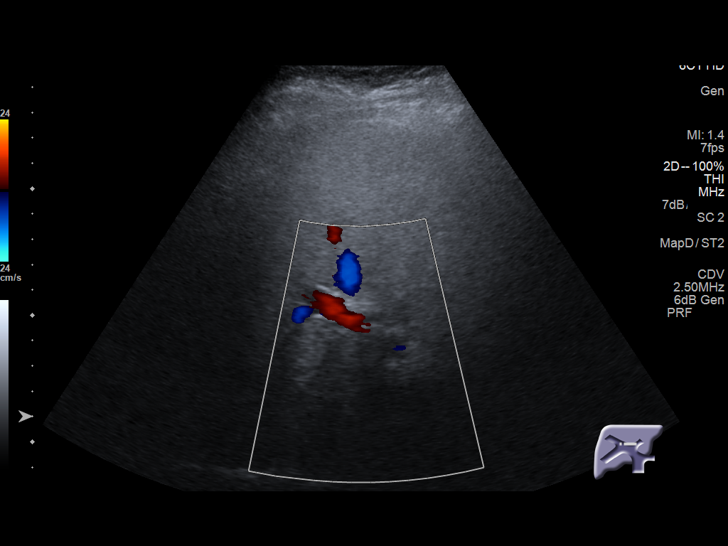
[im 34/37]
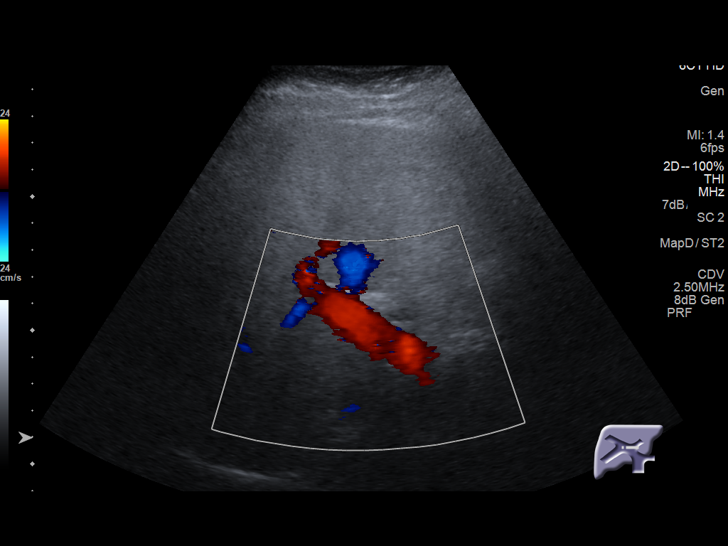
[im 37/37]
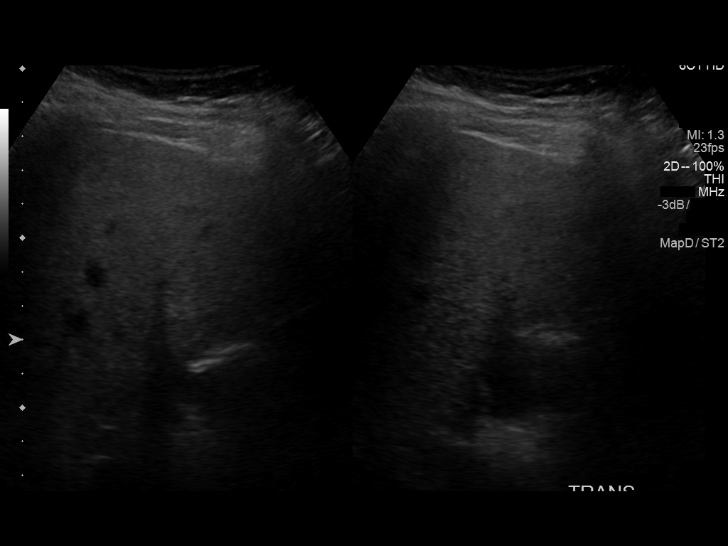

[14 of 25 positions shown; findings below may reference images not displayed]

FINDINGS: Gallbladder:

Cholecystectomy.

Common bile duct:

Diameter: 8 mm. Mild prominence of the common bile duct can be seen
following cholecystectomy. No significant change noted from prior CT
of 11/23/2011. No obstructing abnormality identified. Correlate with
laboratory values.

Liver:

Increased hepatic echogenicity again noted consistent fatty
infiltration and/or hepatocellular disease. No focal hepatic
abnormality identified. Portal vein is patent with normal
directional flow.

Incidental note is made of a questionable 1.3 cm right renal
nonobstructing stone.
IMPRESSION: 1. Cholecystectomy. Mild prominence of the common bile duct noted at
8 mm. Similar findings noted on prior CT of [DATE]. No obstructing
abnormality identified. Correlate with laboratory values.

2. Increased hepatic echogenicity consistent fatty infiltration or
hepatocellular disease. No focal hepatic abnormality identified.

3. Incidental note is made of a questionable 1.3 cm right renal
nonobstructing stone .

## 2019-01-29 ENCOUNTER — Other Ambulatory Visit: Payer: Self-pay | Admitting: Adult Health

## 2019-02-28 ENCOUNTER — Other Ambulatory Visit: Payer: Self-pay | Admitting: Adult Health

## 2019-08-26 ENCOUNTER — Other Ambulatory Visit: Payer: Self-pay

## 2019-08-26 ENCOUNTER — Ambulatory Visit (HOSPITAL_COMMUNITY)
Admission: RE | Admit: 2019-08-26 | Discharge: 2019-08-26 | Disposition: A | Discharge status: Home / Self Care | Payer: BC Managed Care – PPO | Source: Ambulatory Visit | Attending: Physician Assistant | Admitting: Physician Assistant

## 2019-08-26 ENCOUNTER — Other Ambulatory Visit: Payer: Self-pay | Admitting: Physician Assistant

## 2019-08-26 DIAGNOSIS — M5416 Radiculopathy, lumbar region: Secondary | ICD-10-CM

## 2019-08-26 DIAGNOSIS — M79604 Pain in right leg: Secondary | ICD-10-CM

## 2020-01-28 ENCOUNTER — Ambulatory Visit: Payer: BC Managed Care – PPO | Admitting: Physician Assistant

## 2020-05-11 ENCOUNTER — Other Ambulatory Visit (HOSPITAL_COMMUNITY): Payer: Self-pay | Admitting: Family Medicine

## 2020-05-11 DIAGNOSIS — Z1231 Encounter for screening mammogram for malignant neoplasm of breast: Secondary | ICD-10-CM

## 2020-06-03 ENCOUNTER — Ambulatory Visit (HOSPITAL_COMMUNITY): Payer: BC Managed Care – PPO

## 2020-10-04 ENCOUNTER — Ambulatory Visit: Payer: Self-pay | Admitting: Internal Medicine

## 2020-11-01 ENCOUNTER — Ambulatory Visit (INDEPENDENT_AMBULATORY_CARE_PROVIDER_SITE_OTHER): Payer: BC Managed Care – PPO | Admitting: Internal Medicine

## 2020-11-01 ENCOUNTER — Encounter: Payer: Self-pay | Admitting: Internal Medicine

## 2020-11-01 ENCOUNTER — Other Ambulatory Visit: Payer: Self-pay

## 2020-11-01 VITALS — BP 112/84 | HR 87 | Ht 62.0 in | Wt 169.9 lb

## 2020-11-01 DIAGNOSIS — I471 Supraventricular tachycardia: Secondary | ICD-10-CM | POA: Diagnosis not present

## 2020-11-01 DIAGNOSIS — R002 Palpitations: Secondary | ICD-10-CM

## 2020-11-01 NOTE — Progress Notes (Addendum)
Cardiology Office Note   Date:  11/01/2020   ID:  Rebekah Ferrell, DOB 29-Oct-1965, MRN [id number]  PCP:  Jake Samples, PA-C  Cardiologist:   Dorris Carnes, MD   Pt referred for eval of palpitations      History of Present Illness: Rebekah Ferrell is a 55 y.o. female with a history of DM, fatty liver, and SVT.  She was previously followed by [facility])   Developed symptoms in 2015   Underwent ablation in 2016.    The pt says after the ablation it took awhile to feel better, not immediate         The pt says recently she has been having more frequent spells   Feels like an extra beat with kick things off   HR will shoot up for couple min to couple hours   With increase will get nauseated  The pt notes some spells when working on yard this weekend helping husband with tree limb   About 3 or 4 months ago had a syncopal spell   Woke up with racing   Found herself on floor in pool of vomit   Doesn't remember much     Pt does note some problems with gastroparesis.   Drinks about 64 oz per da  Breakfast:  oatmeal/ protein shake Lunch:  Sandwich or salad Photographer and 2 veggies    Current Meds  Medication Sig   ALPRAZolam (XANAX) 0.5 MG tablet TAKE 1 TABLET BY MOUTH 3 TIMES A DAY AS NEEDED FOR ANXIETY.   Biotin 5000 MCG CAPS Take 1 capsule by mouth daily.   Cholecalciferol (VITAMIN D3) 2000 units TABS Take by mouth daily.    empagliflozin (JARDIANCE) 25 MG TABS tablet Take 25 mg by mouth daily.   Guselkumab (TREMFYA) 100 MG/ML SOPN Tremfya 100 mg/mL subcutaneous auto-injector   insulin degludec (TRESIBA FLEXTOUCH) 200 UNIT/ML FlexTouch Pen Inject 200 Units into the skin daily.   lisinopril (PRINIVIL,ZESTRIL) 2.5 MG tablet Take 2.5 mg by mouth daily.    loratadine (CLARITIN) 10 MG tablet Take 10 mg by mouth daily.   metFORMIN (GLUCOPHAGE-XR) 500 MG 24 hr tablet Take 1,000 mg by mouth 2 (two) times daily.   naproxen (NAPROSYN) 500 MG tablet Take 500 mg by mouth 2  (two) times daily with a meal.    nitrofurantoin, macrocrystal-monohydrate, (MACROBID) 100 MG capsule Take 1 capsule (100 mg total) by mouth 2 (two) times daily.   omeprazole (PRILOSEC) 20 MG capsule Take 20 mg by mouth 2 (two) times daily before a meal.   Semaglutide, 1 MG/DOSE, (OZEMPIC, 1 MG/DOSE,) 4 MG/3ML SOPN Ozempic 1 mg/dose (4 mg/3 mL) subcutaneous pen injector   sertraline (ZOLOFT) 50 MG tablet TAKE 1 TABLET BY MOUTH ONCE DAILY. (Patient taking differently: Take 100 mg by mouth daily.)     Allergies:   Benadryl [diphenhydramine], Levaquin [levofloxacin in d5w], Mold extract [trichophyton], and Sulfa antibiotics   Past Medical History:  Diagnosis Date   Anxiety    Cancer ([place])    skin    Chronic kidney disease    kidney stone   Diabetes mellitus without complication ([place])    Hormone replacement therapy (HRT) 11/18/2013   Hot flashes 07/15/2013   Hyperlipidemia    Hypertension    Menopause 09/29/2013   Psoriatic arthritis ([place])    Spongiotic psoriasiform dermatitis    SUI (stress urinary incontinence, female)    Tachycardia     Past Surgical History:  Procedure Laterality Date  CARDIAC ELECTROPHYSIOLOGY MAPPING AND ABLATION     CHOLECYSTECTOMY     ENDOMETRIAL ABLATION     TUBAL LIGATION       Social History:  The patient  reports that she has quit smoking. Her smoking use included cigarettes. She has never used smokeless tobacco. She reports that she does not drink alcohol and does not use drugs.   Family History:  The patient's family history includes Brain cancer in her mother; Cancer in her father and maternal grandmother; Diabetes in her father and sister; Hypertension in her father and mother; Stroke in her maternal grandmother.    ROS:  Please see the history of present illness. All other systems are reviewed and  Negative to the above problem except as noted.    PHYSICAL EXAM: VS:  BP 112/84   Pulse 87   Ht 5\' 2"  (1.575 m)   Wt 169 lb 14.4 oz (77.1 kg)    SpO2 99%   BMI 31.08 kg/m   Orthostatic:   Laying 117/77  P 82  Sitting   111/78  P 85   Standing o min 111/75  P 90   GEN: Well nourished, well developed, in no acute distress  HEENT: normal  Neck: no JVD, carotid bruits, or masses Cardiac: RRR; no murmurs, rubs, or gallops,no edema  Respiratory:  clear to auscultation bilaterally, normal work of breathing GI: soft, nontender, nondistended, + BS  No hepatomegaly  MS: no deformity Moving all extremities   Skin: warm and dry, no rash Neuro:  Strength and sensation are intact Psych: euthymic mood, full affect   EKG:  EKG is ordered today  SR 88 bpm  Low voltage  Nonspecif ST changes     Lipid Panel No results found for: CHOL, TRIG, HDL, CHOLHDL, VLDL, LDLCALC, LDLDIRECT    Wt Readings from Last 3 Encounters:  11/01/20 169 lb 14.4 oz (77.1 kg)  03/21/17 161 lb (73 kg)  12/27/16 161 lb 12.8 oz (73.4 kg)      ASSESSMENT AND PLAN:  1  Palpitations   Pt with remote history of reported SVT  Need to find/review record from ablation procedure   Spells eased after  Now with recurrence of spells of  heart racing    Some of these sound like they could be due to orthostasis   She is not however orthostatic on exam    Spell at night when woke up then on floor sounds like may be due to orthostatic changes   I encouraged her to stay hydrated, stay active    I would also reocmm  that she wear an event monitor to eval   WIll review  Further testing/ referrals depend on results    2  Hx HTN   Keep on low dose lisinopril  3  LIpids  WIll need to review     Current medicines are reviewed at length with the patient today.  The patient does not have concerns regarding medicines.  Signed, Dorris Carnes, MD  11/01/2020 8:45 AM    [facility] [place], [place], [place]  [address] Phone: [phone]; Fax: [phone]

## 2020-11-01 NOTE — Patient Instructions (Signed)
Medication Instructions:  Your physician recommends that you continue on your current medications as directed. Please refer to the Current Medication list given to you today.  *If you need a refill on your cardiac medications before your next appointment, please call your pharmacy*   Lab Work: NONE   If you have labs (blood work) drawn today and your tests are completely normal, you will receive your results only by: [place] (if you have MyChart) OR A paper copy in the mail If you have any lab test that is abnormal or we need to change your treatment, we will call you to review the results.   Testing/Procedures: Your physician has recommended that you wear an event monitor. Event monitors are medical devices that record the heart's electrical activity. Doctors most often [place] these monitors to diagnose arrhythmias. Arrhythmias are problems with the speed or rhythm of the heartbeat. The monitor is a small, portable device. You can wear one while you do your normal daily activities. This is usually used to diagnose what is causing palpitations/syncope (passing out).    Follow-Up: At [facility], you and your health needs are our priority.  As part of our continuing mission to provide you with exceptional heart care, we have created designated Provider Care Teams.  These Care Teams include your primary Cardiologist (physician) and Advanced Practice Providers (APPs -  Physician Assistants and Nurse Practitioners) who all work together to provide you with the care you need, when you need it.  We recommend signing up for the patient portal called "MyChart".  Sign up information is provided on this After Visit Summary.  MyChart is used to connect with patients for Virtual Visits (Telemedicine).  Patients are able to view lab/test results, encounter notes, upcoming appointments, etc.  Non-urgent messages can be sent to your provider as well.   To learn more about what you can do with  MyChart, go to [email or web address].    Your next appointment:    Pending test results   The format for your next appointment:   In Person  Provider:   Dorris Carnes, MD   Other Instructions Thank you for choosing [facility]!

## 2020-11-06 DIAGNOSIS — I471 Supraventricular tachycardia: Secondary | ICD-10-CM

## 2020-11-06 DIAGNOSIS — R002 Palpitations: Secondary | ICD-10-CM

## 2020-11-10 ENCOUNTER — Encounter (INDEPENDENT_AMBULATORY_CARE_PROVIDER_SITE_OTHER): Payer: BC Managed Care – PPO

## 2020-11-10 NOTE — Addendum Note (Signed)
Addended by: Levonne Hubert on: 11/10/2020 05:05 PM   Modules accepted: Orders

## 2020-11-10 NOTE — Addendum Note (Signed)
Addended by: Levonne Hubert on: 11/10/2020 05:08 PM   Modules accepted: Orders

## 2020-12-22 NOTE — Progress Notes (Signed)
Cardiology Office Note   Date:  12/23/2020   ID:  Rebekah Ferrell, DOB [birth year], MRN [id number]  PCP:  Jake Samples, PA-C  Cardiologist:   Dorris Carnes, MD   Presents for f/u of palpitations      History of Present Illness: Rebekah Ferrell is a 55 y.o. female with a history of DM, fatty liver, and SVT.  She was previously followed by [facility])   Developed symptoms in 2015   Underwent ablation in 2016.    The pt says after the ablation it took awhile to feel better, not immediate         I saw the pt earlier this summer   She said that recently she had been having more spells  Feels like an extra beat with kick things off   HR will shoot up for couple min to couple hours   With increase will get nauseated  The pt notes some spells when working on yard this weekend helping husband with tree limb   About 3 or 4 months ago had a syncopal spell   Woke up with racing   Found herself on floor in pool of vomit   Doesn't remember much  Concerning    When I saw her I set her up for a monitor   This did not catch any arrhythmia   The pt says she still feels heart is faster at times   Will get a Kardia mobile device    Staying active   No synocpe     Current Meds  Medication Sig   ALPRAZolam (XANAX) 0.5 MG tablet TAKE 1 TABLET BY MOUTH 3 TIMES A DAY AS NEEDED FOR ANXIETY.   Biotin 5000 MCG CAPS Take 1 capsule by mouth daily.   Cholecalciferol (VITAMIN D3) 2000 units TABS Take by mouth daily.    empagliflozin (JARDIANCE) 25 MG TABS tablet Take 25 mg by mouth daily.   finasteride (PROPECIA) 1 MG tablet Take 1 mg by mouth daily.   Guselkumab (TREMFYA) 100 MG/ML SOPN Tremfya 100 mg/mL subcutaneous auto-injector   insulin degludec (TRESIBA FLEXTOUCH) 200 UNIT/ML FlexTouch Pen Inject 200 Units into the skin daily.   lisinopril (PRINIVIL,ZESTRIL) 2.5 MG tablet Take 2.5 mg by mouth daily.    loratadine (CLARITIN) 10 MG tablet Take 10 mg by mouth daily.   metFORMIN  (GLUCOPHAGE-XR) 500 MG 24 hr tablet Take 1,000 mg by mouth 2 (two) times daily.   naproxen (NAPROSYN) 500 MG tablet Take 500 mg by mouth 2 (two) times daily with a meal.    nitrofurantoin, macrocrystal-monohydrate, (MACROBID) 100 MG capsule Take 1 capsule (100 mg total) by mouth 2 (two) times daily.   omeprazole (PRILOSEC) 20 MG capsule Take 20 mg by mouth 2 (two) times daily before a meal.   Semaglutide, 1 MG/DOSE, (OZEMPIC, 1 MG/DOSE,) 4 MG/3ML SOPN Ozempic 1 mg/dose (4 mg/3 mL) subcutaneous pen injector   sertraline (ZOLOFT) 50 MG tablet TAKE 1 TABLET BY MOUTH ONCE DAILY. (Patient taking differently: Take 100 mg by mouth daily.)     Allergies:   Benadryl [diphenhydramine], Levaquin [levofloxacin in d5w], Mold extract [trichophyton], and Sulfa antibiotics   Past Medical History:  Diagnosis Date   Anxiety    Cancer ([place])    skin    Chronic kidney disease    kidney stone   Diabetes mellitus without complication ([place])    Hormone replacement therapy (HRT) 11/18/2013   Hot flashes 07/15/2013   Hyperlipidemia    Hypertension  Menopause 09/29/2013   Psoriatic arthritis ([place])    Spongiotic psoriasiform dermatitis    SUI (stress urinary incontinence, female)    Tachycardia     Past Surgical History:  Procedure Laterality Date   CARDIAC ELECTROPHYSIOLOGY MAPPING AND ABLATION     CHOLECYSTECTOMY     ENDOMETRIAL ABLATION     TUBAL LIGATION       Social History:  The patient  reports that she has quit smoking. Her smoking use included cigarettes. She has never used smokeless tobacco. She reports that she does not drink alcohol and does not use drugs.   Family History:  The patient's family history includes Brain cancer in her mother; Cancer in her father and maternal grandmother; Diabetes in her father and sister; Hypertension in her father and mother; Stroke in her maternal grandmother.    ROS:  Please see the history of present illness. All other systems are reviewed and  Negative to  the above problem except as noted.    PHYSICAL EXAM: VS:  BP 130/70   Pulse 89   Ht '5\' 2"'$  (1.575 m)   Wt 172 lb (78 kg)   SpO2 97%   BMI 31.46 kg/m  \  GEN: Well nourished, well developed, in no acute distress  HEENT: normal  Neck: no JVD, carotid bruits Cardiac: RRR; no murmurs   No LE edema  Respiratory:  clear to auscultation bilaterally, GI: soft, nontender, nondistended, + BS  No hepatomegaly  MS: no deformity Moving all extremities   Skin: warm and dry, no rash Neuro:  Strength and sensation are intact Psych: euthymic mood, full affect   EKG:  EKG is not  ordered today    Lipid Panel No results found for: CHOL, TRIG, HDL, CHOLHDL, VLDL, LDLCALC, LDLDIRECT    Wt Readings from Last 3 Encounters:  12/23/20 172 lb (78 kg)  11/01/20 169 lb 14.4 oz (77.1 kg)  03/21/17 161 lb (73 kg)      ASSESSMENT AND PLAN:  1  Palpitations     Pt still feeling spells   Monitor did not show anything    She has a remote history of SVT  ts/p ablation     I encouraged her to stay hydrated, stay active    Agree with Kardia device. WIll review meds for BP   May benefit from b blocker   SHe has been on other meds in the past   I do not want to put her on something that she tried and didn't tolerate    Told her I would call    2  Hx HTN   As noted above  Will review     3  LIpids  LDL 138  HDL 81  Total 214  Trig 82     4   DM   Keep on meds      Current medicines are reviewed at length with the patient today.  The patient does not have concerns regarding medicines.  Signed, Dorris Carnes, MD  12/23/2020 10:13 AM    [facility], [place], [place]  [address] Phone: [phone]; Fax: [phone]

## 2020-12-23 ENCOUNTER — Other Ambulatory Visit: Payer: Self-pay

## 2020-12-23 ENCOUNTER — Ambulatory Visit: Payer: BC Managed Care – PPO | Admitting: Internal Medicine

## 2020-12-23 ENCOUNTER — Encounter: Payer: Self-pay | Admitting: Internal Medicine

## 2020-12-23 VITALS — BP 130/70 | HR 89 | Ht 62.0 in | Wt 172.0 lb

## 2020-12-23 DIAGNOSIS — R002 Palpitations: Secondary | ICD-10-CM | POA: Diagnosis not present

## 2020-12-23 NOTE — Patient Instructions (Signed)
Medication Instructions:  Your physician recommends that you continue on your current medications as directed. Please refer to the Current Medication list given to you today.  *If you need a refill on your cardiac medications before your next appointment, please call your pharmacy*   Lab Work: NONE   If you have labs (blood work) drawn today and your tests are completely normal, you will receive your results only by: MyChart Message (if you have MyChart) OR A paper copy in the mail If you have any lab test that is abnormal or we need to change your treatment, we will call you to review the results.   Testing/Procedures: NONE    Follow-Up: At [facility], you and your health needs are our priority.  As part of our continuing mission to provide you with exceptional heart care, we have created designated Provider Care Teams.  These Care Teams include your primary Cardiologist (physician) and Advanced Practice Providers (APPs -  Physician Assistants and Nurse Practitioners) who all work together to provide you with the care you need, when you need it.  We recommend signing up for the patient portal called "MyChart".  Sign up information is provided on this After Visit Summary.  MyChart is used to connect with patients for Virtual Visits (Telemedicine).  Patients are able to view lab/test results, encounter notes, upcoming appointments, etc.  Non-urgent messages can be sent to your provider as well.   To learn more about what you can do with MyChart, go to [email or web address].    Your next appointment:    To Be Determined   The format for your next appointment:   In Person  Provider:   Paula Ross, MD   Other Instructions Thank you for choosing [facility]!    

## 2020-12-24 ENCOUNTER — Telehealth: Payer: Self-pay | Admitting: Internal Medicine

## 2020-12-24 NOTE — Telephone Encounter (Signed)
[name], MD  [name], [name], LPN LDL is 624THL  With Hx of DM, on meds Recommendations are that it should be lower  Below 100 for sure  I would recomm Crestor 10 mg    F/U with Lipomed panel in 8 wks with liver panel     I spoke with patient.She states she is working with her pcp on dietary changes and at this point she is not interested in starting a statin.    I will FYI Dr.Ross

## 2020-12-24 NOTE — Telephone Encounter (Signed)
Patient returned Dr. Harrington Challenger call. Advised patient Dr is in clinic seeing patients at the moment. She can be reached at [phone].

## 2021-01-06 ENCOUNTER — Ambulatory Visit: Payer: BC Managed Care – PPO | Admitting: Internal Medicine

## 2021-03-01 ENCOUNTER — Other Ambulatory Visit (HOSPITAL_COMMUNITY): Payer: Self-pay | Admitting: Family Medicine

## 2021-03-01 DIAGNOSIS — Z1231 Encounter for screening mammogram for malignant neoplasm of breast: Secondary | ICD-10-CM

## 2021-03-11 ENCOUNTER — Ambulatory Visit (HOSPITAL_COMMUNITY)
Admission: RE | Admit: 2021-03-11 | Discharge: 2021-03-11 | Disposition: A | Discharge status: Home / Self Care | Payer: BC Managed Care – PPO | Source: Ambulatory Visit | Attending: Family Medicine | Admitting: Family Medicine

## 2021-03-11 ENCOUNTER — Other Ambulatory Visit: Payer: Self-pay

## 2021-03-11 DIAGNOSIS — Z1231 Encounter for screening mammogram for malignant neoplasm of breast: Secondary | ICD-10-CM | POA: Diagnosis present

## 2022-02-21 IMAGING — MG MM DIGITAL SCREENING BILAT W/ TOMO AND CAD
6 of 10 series · 6 of 30 positions shown · non-contrast
Comparison: Previous exam(s).

CLINICAL DATA: Screening.

EXAM:
DIGITAL SCREENING BILATERAL MAMMOGRAM WITH TOMOSYNTHESIS AND CAD
TECHNIQUE: Bilateral screening digital craniocaudal and mediolateral oblique
mammograms were obtained. Bilateral screening digital breast
tomosynthesis was performed. The images were evaluated with
computer-aided detection.

[R CC synth-2D]
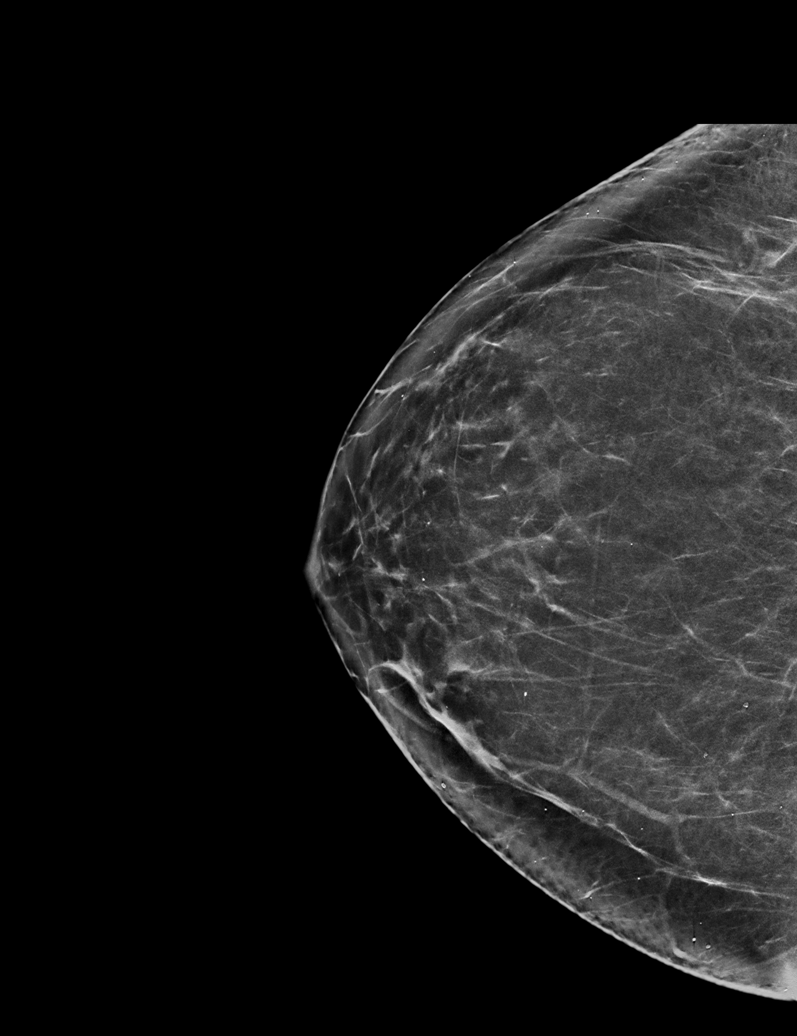

[L CC synth-2D]
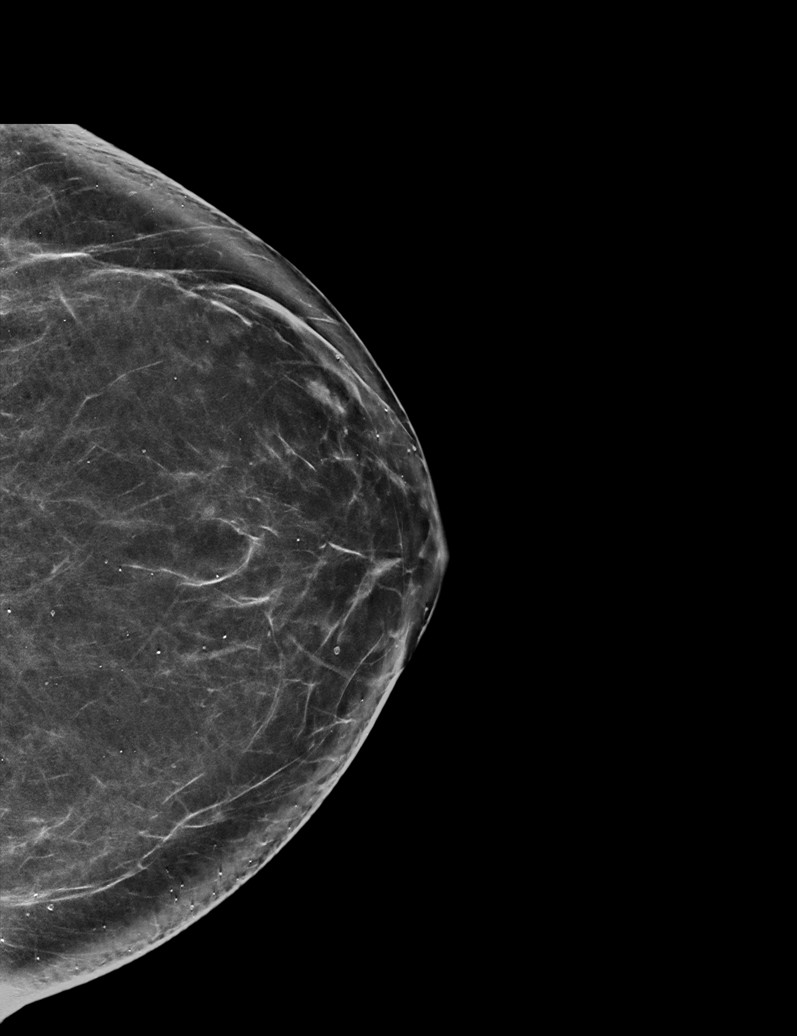

[L MLO synth-2D (1 of 2)]
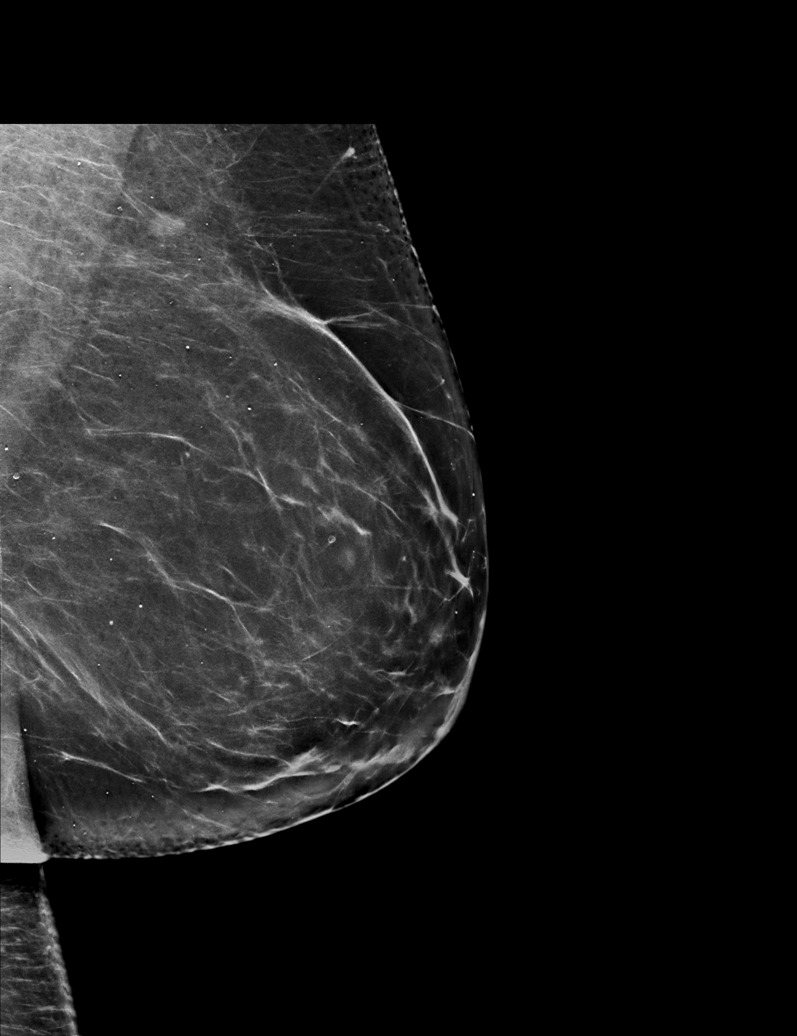

[L MLO synth-2D (2 of 2)]
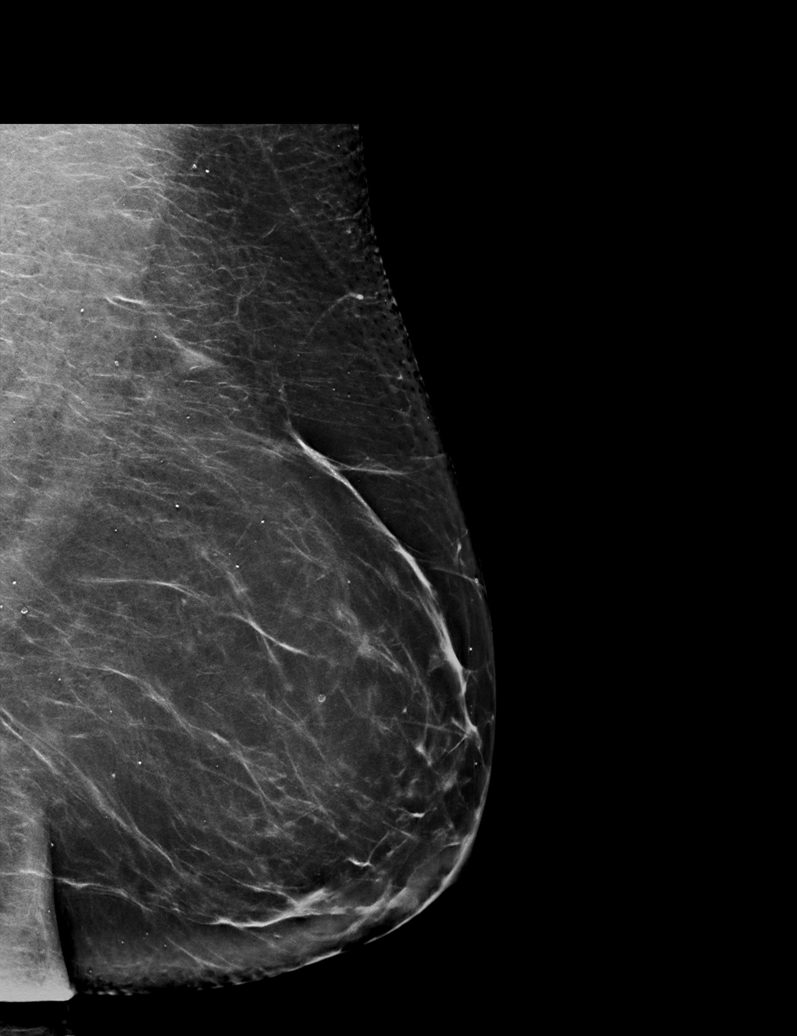

[R MLO synth-2D]
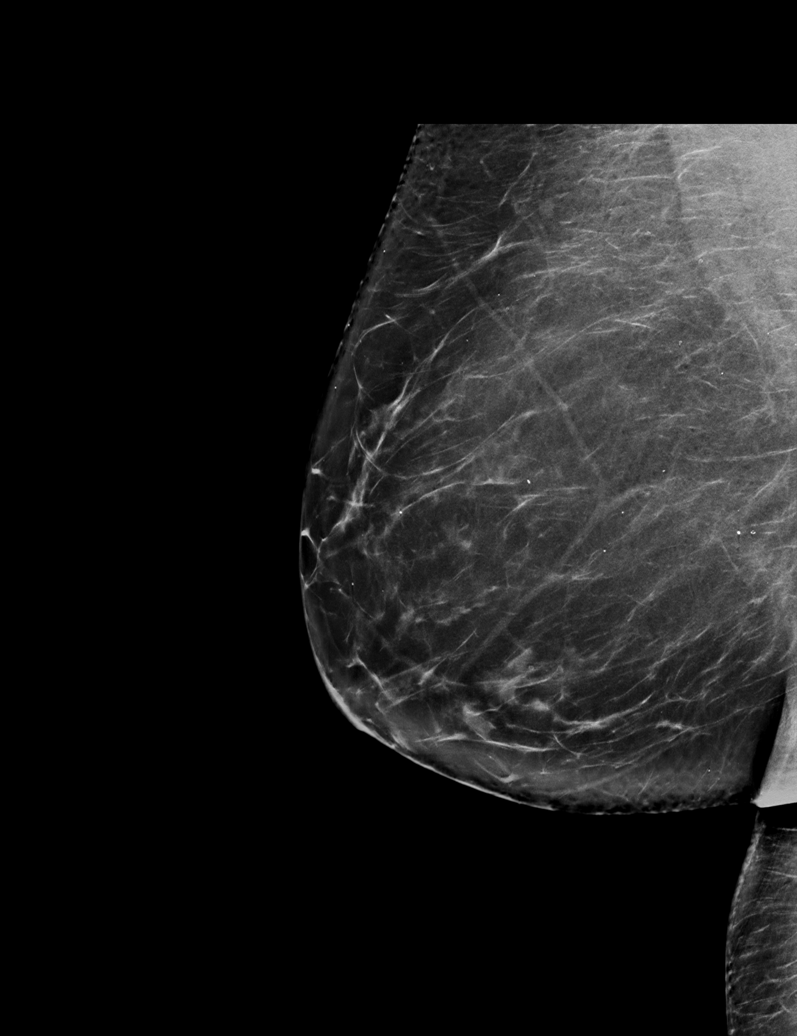

[R CC tomo · tomo slice 37/74.0]
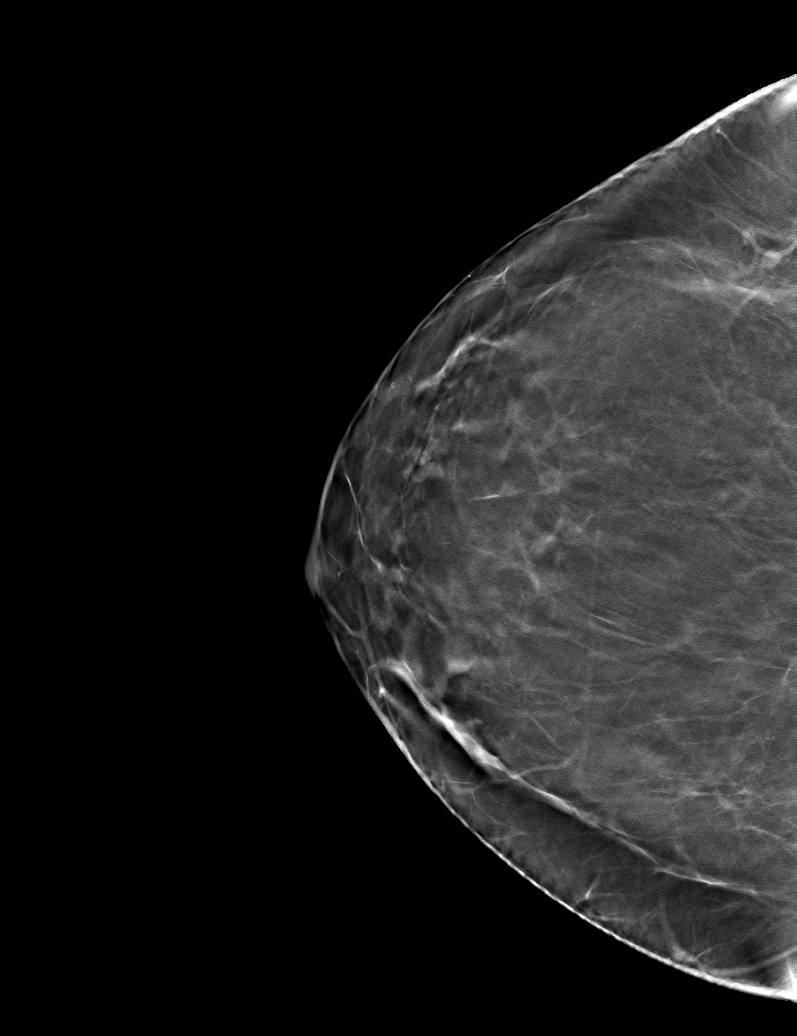

[6 of 30 positions shown; findings below may reference images not displayed]

ACR Breast Density Category b: There are scattered areas of
fibroglandular density.
FINDINGS: There are no findings suspicious for malignancy.
IMPRESSION: No mammographic evidence of malignancy. A result letter of this
screening mammogram will be mailed directly to the patient.

RECOMMENDATION:
Screening mammogram in one year. (Code:51-O-LD2)

BI-RADS CATEGORY  1: Negative.

## 2022-05-19 ENCOUNTER — Other Ambulatory Visit (HOSPITAL_COMMUNITY): Payer: Self-pay | Admitting: Family Medicine

## 2022-05-19 DIAGNOSIS — Z1231 Encounter for screening mammogram for malignant neoplasm of breast: Secondary | ICD-10-CM

## 2022-05-25 ENCOUNTER — Ambulatory Visit (HOSPITAL_COMMUNITY)
Admission: RE | Admit: 2022-05-25 | Discharge: 2022-05-25 | Disposition: A | Discharge status: Home / Self Care | Payer: BC Managed Care – PPO | Source: Ambulatory Visit | Attending: Family Medicine | Admitting: Family Medicine

## 2022-05-25 DIAGNOSIS — Z1231 Encounter for screening mammogram for malignant neoplasm of breast: Secondary | ICD-10-CM | POA: Diagnosis present

## 2023-07-13 ENCOUNTER — Other Ambulatory Visit (HOSPITAL_COMMUNITY): Payer: Self-pay | Admitting: Family Medicine

## 2023-07-13 DIAGNOSIS — Z1231 Encounter for screening mammogram for malignant neoplasm of breast: Secondary | ICD-10-CM

## 2023-07-16 ENCOUNTER — Ambulatory Visit (HOSPITAL_COMMUNITY)
Admission: RE | Admit: 2023-07-16 | Discharge: 2023-07-16 | Disposition: A | Discharge status: Home / Self Care | Payer: 59 | Source: Ambulatory Visit | Attending: Family Medicine | Admitting: Family Medicine

## 2023-07-16 ENCOUNTER — Encounter (HOSPITAL_COMMUNITY): Payer: Self-pay

## 2023-07-16 DIAGNOSIS — Z1231 Encounter for screening mammogram for malignant neoplasm of breast: Secondary | ICD-10-CM | POA: Diagnosis present

## 2024-02-11 ENCOUNTER — Emergency Department (HOSPITAL_COMMUNITY)

## 2024-02-11 ENCOUNTER — Encounter (HOSPITAL_COMMUNITY): Payer: Self-pay

## 2024-02-11 ENCOUNTER — Other Ambulatory Visit: Payer: Self-pay

## 2024-02-11 ENCOUNTER — Emergency Department (HOSPITAL_COMMUNITY)
Admission: EM | Admit: 2024-02-11 | Discharge: 2024-02-11 | Disposition: A | Discharge status: Home / Self Care | Attending: Emergency Medicine | Admitting: Emergency Medicine

## 2024-02-11 DIAGNOSIS — Z79899 Other long term (current) drug therapy: Secondary | ICD-10-CM | POA: Insufficient documentation

## 2024-02-11 DIAGNOSIS — E119 Type 2 diabetes mellitus without complications: Secondary | ICD-10-CM | POA: Insufficient documentation

## 2024-02-11 DIAGNOSIS — Z7984 Long term (current) use of oral hypoglycemic drugs: Secondary | ICD-10-CM | POA: Insufficient documentation

## 2024-02-11 DIAGNOSIS — K529 Noninfective gastroenteritis and colitis, unspecified: Secondary | ICD-10-CM | POA: Insufficient documentation

## 2024-02-11 DIAGNOSIS — R1084 Generalized abdominal pain: Secondary | ICD-10-CM | POA: Diagnosis present

## 2024-02-11 DIAGNOSIS — I10 Essential (primary) hypertension: Secondary | ICD-10-CM | POA: Diagnosis not present

## 2024-02-11 DIAGNOSIS — Z794 Long term (current) use of insulin: Secondary | ICD-10-CM | POA: Insufficient documentation

## 2024-02-11 LAB — CBC WITH DIFFERENTIAL/PLATELET
Abs Immature Granulocytes: 0.05 K/uL (ref 0.00–0.07)
Basophils Absolute: 0 K/uL (ref 0.0–0.1)
Basophils Relative: 0 %
Eosinophils Absolute: 0.1 K/uL (ref 0.0–0.5)
Eosinophils Relative: 1 %
HCT: 37.6 % (ref 36.0–46.0)
Hemoglobin: 12 g/dL (ref 12.0–15.0)
Immature Granulocytes: 1 %
Lymphocytes Relative: 25 %
Lymphs Abs: 2 K/uL (ref 0.7–4.0)
MCH: 28.3 pg (ref 26.0–34.0)
MCHC: 31.9 g/dL (ref 30.0–36.0)
MCV: 88.7 fL (ref 80.0–100.0)
Monocytes Absolute: 0.8 K/uL (ref 0.1–1.0)
Monocytes Relative: 9 %
Neutro Abs: 5.3 K/uL (ref 1.7–7.7)
Neutrophils Relative %: 64 %
Platelets: 333 K/uL (ref 150–400)
RBC: 4.24 MIL/uL (ref 3.87–5.11)
RDW: 13.2 % (ref 11.5–15.5)
WBC: 8.3 K/uL (ref 4.0–10.5)
nRBC: 0 % (ref 0.0–0.2)

## 2024-02-11 LAB — COMPREHENSIVE METABOLIC PANEL WITH GFR
ALT: 43 U/L (ref 0–44)
AST: 33 U/L (ref 15–41)
Albumin: 4 g/dL (ref 3.5–5.0)
Alkaline Phosphatase: 74 U/L (ref 38–126)
Anion gap: 13 (ref 5–15)
BUN: 10 mg/dL (ref 6–20)
CO2: 22 mmol/L (ref 22–32)
Calcium: 9.4 mg/dL (ref 8.9–10.3)
Chloride: 102 mmol/L (ref 98–111)
Creatinine, Ser: 0.85 mg/dL (ref 0.44–1.00)
GFR, Estimated: >60 mL/min (ref >60)
Glucose, Bld: 135 mg/dL — ABNORMAL HIGH (ref 70–99)
Potassium: 4.4 mmol/L (ref 3.5–5.1)
Sodium: 137 mmol/L (ref 135–145)
Total Bilirubin: 0.7 mg/dL (ref 0.0–1.2)
Total Protein: 7.8 g/dL (ref 6.5–8.1)

## 2024-02-11 LAB — URINALYSIS, ROUTINE W REFLEX MICROSCOPIC
Bacteria, UA: NONE SEEN
Bilirubin Urine: NEGATIVE
Glucose, UA: >=500 mg/dL — AB
Hgb urine dipstick: NEGATIVE
Ketones, ur: 5 mg/dL — AB
Leukocytes,Ua: NEGATIVE
Nitrite: NEGATIVE
Protein, ur: NEGATIVE mg/dL
Specific Gravity, Urine: 1.026 (ref 1.005–1.030)
pH: 5 (ref 5.0–8.0)

## 2024-02-11 MED ORDER — AMOXICILLIN-POT CLAVULANATE 875-125 MG PO TABS: 1.0000 | ORAL_TABLET | Freq: Two times a day (BID) | ORAL | 0 refills | Status: DC

## 2024-02-11 MED ORDER — SODIUM CHLORIDE 0.9 % IV BOLUS: 1000.0000 mL | Freq: Once | INTRAVENOUS | Status: DC

## 2024-02-11 MED ORDER — IOHEXOL 350 MG/ML SOLN
75.0000 mL | Freq: Once | INTRAVENOUS | Status: AC | PRN
Start: 2024-02-11 — End: 2024-02-11
  Administered 2024-02-11: 75 mL via INTRAVENOUS

## 2024-02-11 MED ORDER — ONDANSETRON 4 MG PO TBDP: ORAL_TABLET | ORAL | 0 refills | Status: AC

## 2024-02-11 NOTE — Discharge Instructions (Addendum)
 You have colitis.   Take zofran  for nausea   Stay hydrated  By Friday, if you have still pain, take augmentin as prescribed   See your GI doctor for follow up   Return to ER if you have worse abdominal pain, vomiting, fever

## 2024-02-11 NOTE — ED Provider Notes (Signed)
 [place] [facility] AT [facility] Provider Note   CSN: [id number] Arrival date & time: 02/11/24  1619     Patient presents with: Rectal Bleeding   Rebekah Ferrell is a 58 y.o. female.   Rectal Bleeding Patient is a 58 year old female with PMH of HTN, HLD, SVT s/p ablation (2016), and sling dependent diabetes presenting to the ED referral PCP -patient reports that last Tuesday 09/23, patient experienced sudden onset nausea with NBNB emesis.  She reports the following day, she had a single episode of loose BM precipitated by generalized abdominal/cramping pain with reported blood clots and mucous.  Since this time, patient reports intermittent episodes of LLQ cramping abdominal pain with accompanying 1-2 loose BMs daily.  She denies ongoing blood in stool or dark/melanotic stools.  Patient further denies fevers, chills, ongoing nausea, chest pain, shortness of breath, changes in urination, or constipation.  Prior to Admission medications   Medication Sig Start Date End Date Taking? Authorizing Provider  amoxicillin-clavulanate (AUGMENTIN) 875-125 MG tablet Take 1 tablet by mouth every 12 (twelve) hours. 02/11/24  Yes Patt Alm Macho, MD  ondansetron  (ZOFRAN -ODT) 4 MG disintegrating tablet 4mg  ODT q4 hours prn nausea/vomit 02/11/24  Yes Patt Alm Macho, MD  ALPRAZolam  (XANAX ) 0.5 MG tablet TAKE 1 TABLET BY MOUTH 3 TIMES A DAY AS NEEDED FOR ANXIETY. 01/29/19   Kizzie Suzen JONELLE, CNM  Biotin 5000 MCG CAPS Take 1 capsule by mouth daily.    [provider]  Cholecalciferol (VITAMIN D3) 2000 units TABS Take by mouth daily.     [provider]  empagliflozin (JARDIANCE) 25 MG TABS tablet Take 25 mg by mouth daily.    [provider]  finasteride (PROPECIA) 1 MG tablet Take 1 mg by mouth daily. 11/25/20   [provider]  Guselkumab (TREMFYA) 100 MG/ML SOPN Tremfya 100 mg/mL subcutaneous auto-injector    [provider]   insulin degludec (TRESIBA FLEXTOUCH) 200 UNIT/ML FlexTouch Pen Inject 200 Units into the skin daily.    [provider]  lisinopril (PRINIVIL,ZESTRIL) 2.5 MG tablet Take 2.5 mg by mouth daily.  10/02/16   [provider]  loratadine (CLARITIN) 10 MG tablet Take 10 mg by mouth daily.    [provider]  metFORMIN (GLUCOPHAGE-XR) 500 MG 24 hr tablet Take 1,000 mg by mouth 2 (two) times daily. 12/22/16   [provider]  naproxen (NAPROSYN) 500 MG tablet Take 500 mg by mouth 2 (two) times daily with a meal.  10/04/16   [provider]  nitrofurantoin , macrocrystal-monohydrate, (MACROBID ) 100 MG capsule Take 1 capsule (100 mg total) by mouth 2 (two) times daily. 10/02/17   Signa Delon LABOR, NP  omeprazole (PRILOSEC) 20 MG capsule Take 20 mg by mouth 2 (two) times daily before a meal.    [provider]  Semaglutide, 1 MG/DOSE, (OZEMPIC, 1 MG/DOSE,) 4 MG/3ML SOPN Ozempic 1 mg/dose (4 mg/3 mL) subcutaneous pen injector    [provider]  sertraline  (ZOLOFT ) 50 MG tablet TAKE 1 TABLET BY MOUTH ONCE DAILY. Patient taking differently: Take 100 mg by mouth daily. 02/28/19   Signa Delon LABOR, NP    Allergies: Benadryl  [diphenhydramine ], Levaquin [levofloxacin in d5w], Mold extract [trichophyton], and Sulfa antibiotics    Review of Systems  Gastrointestinal:  Positive for hematochezia.    Updated Vital Signs BP (!) 148/79 (BP Location: Left Arm)   Pulse 82   Temp 98.3 F (36.8 C)   Resp 16   Ht 5' 2 (  1.575 m)   Wt 74.4 kg   SpO2 100%   BMI 30.00 kg/m   Physical Exam Constitutional:      Appearance: Normal appearance.  HENT:     Head: Normocephalic and atraumatic.     Right Ear: Ear canal and external ear normal.     Left Ear: Ear canal and external ear normal.     Nose: Nose normal.     Mouth/Throat:     Mouth: Mucous membranes are dry.     Pharynx: Oropharynx is clear.  Eyes:     Extraocular Movements: Extraocular  movements intact.     Pupils: Pupils are equal, round, and reactive to light.  Cardiovascular:     Rate and Rhythm: Normal rate and regular rhythm.     Pulses: Normal pulses.     Heart sounds: Normal heart sounds.  Pulmonary:     Effort: Pulmonary effort is normal.     Breath sounds: Normal breath sounds.  Abdominal:     General: Abdomen is flat.     Tenderness: There is abdominal tenderness. There is no guarding or rebound.     Comments: LLQ tenderness to palpation without rebound or guarding  Musculoskeletal:        General: Normal range of motion.     Cervical back: Normal range of motion.  Skin:    General: Skin is warm and dry.  Neurological:     General: No focal deficit present.     Mental Status: She is alert and oriented to person, place, and time.     (all labs ordered are listed, but only abnormal results are displayed) Labs Reviewed  COMPREHENSIVE METABOLIC PANEL WITH GFR - Abnormal; Notable for the following components:      Result Value   Glucose, Bld 135 (*)    All other components within normal limits  URINALYSIS, ROUTINE W REFLEX MICROSCOPIC - Abnormal; Notable for the following components:   Glucose, UA >=500 (*)    Ketones, ur 5 (*)    All other components within normal limits  CBC WITH DIFFERENTIAL/PLATELET    EKG: None  Radiology: CT ABDOMEN PELVIS W CONTRAST Result Date: 02/11/2024 CLINICAL DATA:  Generalized abdominal pain and rectal bleeding. EXAM: CT ABDOMEN AND PELVIS WITH CONTRAST TECHNIQUE: Multidetector CT imaging of the abdomen and pelvis was performed using the standard protocol following bolus administration of intravenous contrast. RADIATION DOSE REDUCTION: This exam was performed according to the departmental dose-optimization program which includes automated exposure control, adjustment of the mA and/or kV according to patient size and/or use of iterative reconstruction technique. CONTRAST:  75mL OMNIPAQUE IOHEXOL 350 MG/ML SOLN COMPARISON:   November 22, 2001 FINDINGS: Lower chest: No acute abnormality. Hepatobiliary: There is diffuse fatty infiltration of the liver parenchyma. No focal liver abnormality is seen. Status post cholecystectomy. No biliary dilatation. Pancreas: Unremarkable. No pancreatic ductal dilatation or surrounding inflammatory changes. Spleen: Normal in size without focal abnormality. Adrenals/Urinary Tract: Adrenal glands are unremarkable. Kidneys are normal, without renal calculi or focal lesions. Prominent bilateral extrarenal pelvis are seen. Bladder is unremarkable. Stomach/Bowel: Stomach is within normal limits. Appendix appears normal. No evidence of bowel dilatation. Mildly thickened and poorly distended descending colon is seen. Vascular/Lymphatic: Aortic atherosclerosis. No enlarged abdominal or pelvic lymph nodes. Reproductive: Uterus and bilateral adnexa are unremarkable. Other: No abdominal wall hernia or abnormality. No abdominopelvic ascites. Musculoskeletal: No acute or significant osseous findings. IMPRESSION: 1. Mildly thickened and poorly distended descending colon which may represent sequelae associated with mild  colitis. 2. Hepatic steatosis. 3. Evidence of prior cholecystectomy. 4. Aortic atherosclerosis. Electronically Signed   By: Suzen Dials M.D.   On: 02/11/2024 19:06     Procedures   Medications Ordered in the ED  iohexol (OMNIPAQUE) 350 MG/ML injection 75 mL (75 mLs Intravenous Contrast Given 02/11/24 1840)                                    Medical Decision Making Risk Prescription drug management.   58 year old female with PMH as above presenting to the ED as referred from PCP with concern for single episode of bloody/mucus BM 09/24 in the setting of nausea with NBNB emesis 09/23 and recent travel to [place] .  Patient denying any recent exotic foods.  She reports intermittent 1-2 episodes of loose stool daily since this time, however denies ongoing fevers, chills, nausea, vomiting.   No further bloody bowel movement since this time.  On arrival, patient hemodynamically stable in no acute distress.  Normotensive.  Afebrile.  Nontoxic-appearing.  No tachycardia or tachypnea.  Saturating 100% on RA.  GCS 15.  Abdominal examination significant for mild tenderness to palpation in LLQ.  No rebound or guarding.  Doubt acute peritonitis.  Patient clinically mildly dehydrated with dry oral mucosa, however normal shock index and brisk capillary refill.  Rectal examination performed with RN chaperone present at bedside.  No obvious lesions, hemorrhoids, or fissures.  No evidence of active bleeding.  CMP without acute electrolytic disturbance or evidence of renal impairment.  No AKI.  CBC with stable cell lines.  No leukocytosis or anemia.  Urinalysis unremarkable in the setting of no acute urinary symptoms.  CTAP with contrast indicative of radiographic evidence of mild colitis of the descending colon.  Favored etiology at this time is traveler's diarrhea, however patient clinically improving with intermittent loose stools and mild abdominal pain.  Antibiotics not indicated at this time.  Patient counseled on p.o. rehydration.  At this time, patient hemodynamically stable and appropriate for discharge.  Strict return precautions discussed.  All questions answered at bedside.  Patient agreeable plan of care.  Final diagnoses:  Colitis    ED Discharge Orders          Ordered    amoxicillin-clavulanate (AUGMENTIN) 875-125 MG tablet  Every 12 hours        02/11/24 1932    ondansetron  (ZOFRAN -ODT) 4 MG disintegrating tablet        02/11/24 1932               Taylor Levick, Elsie, MD 02/11/24 1945    Patt Alm Macho, MD 02/14/24 1455

## 2024-02-11 NOTE — ED Provider Triage Note (Signed)
 Emergency Medicine Provider Triage Evaluation Note  Rebekah Ferrell , a 58 y.o. female  was evaluated in triage.  Pt complains of abdominal pain, rectal bleeding.  6 days ago patient had an episode of vomiting and diarrhea.  The following day she was passing red blood clots in the stool with associated mucus.  She then had constipation for several days.  Followed up with PCP today.  She has had significant abdominal discomfort, slightly improved.  She has felt lightheaded at times, no full syncope.  No chest pains.  Denies other bleeding.  Sent to the emergency department for further evaluation.  Review of Systems  Positive: Abdominal pain, rectal bleeding Negative: Syncope  Physical Exam  BP (!) 154/79 (BP Location: Right Arm)   Pulse 81   Temp 98.3 F (36.8 C)   Resp 16   Ht 5' 2 (1.575 m)   Wt 74.4 kg   SpO2 98%   BMI 30.00 kg/m  Gen:   Awake, no distress   Resp:  Normal effort  MSK:   Moves extremities without difficulty  Other:  Generalized abdominal tenderness to palpation, no rebound or guarding, no conjunctival pallor  Medical Decision Making  Medically screening exam initiated at 4:43 PM.  Appropriate orders placed.  LESTER PLATAS was informed that the remainder of the evaluation will be completed by another provider, this initial triage assessment does not replace that evaluation, and the importance of remaining in the ED until their evaluation is complete.  Labs, CT abdomen pelvis to evaluate for signs of colitis, diverticulitis, other intra-abdominal problem.   Desiderio Chew, PA-C 02/11/24 1645

## 2024-02-11 NOTE — ED Triage Notes (Signed)
 Pt came in via POV d/t being advised by her PCP to come in for eval d/t 10 days of bright red stools  with clots. Also has abd tenderness & her PCP noted absent bowel sounds. A/Ox4, denies acute pain but reports feeling very uncomfortable, endorses feeling lightheaded, dizzy, lethargic & nauseous.

## 2024-02-14 ENCOUNTER — Ambulatory Visit: Admitting: Internal Medicine

## 2024-02-14 VITALS — BP 120/74 | HR 94 | Temp 98.6°F | Ht 62.0 in | Wt 167.8 lb

## 2024-02-14 DIAGNOSIS — K219 Gastro-esophageal reflux disease without esophagitis: Secondary | ICD-10-CM | POA: Diagnosis not present

## 2024-02-14 DIAGNOSIS — R1032 Left lower quadrant pain: Secondary | ICD-10-CM

## 2024-02-14 DIAGNOSIS — K3184 Gastroparesis: Secondary | ICD-10-CM

## 2024-02-14 DIAGNOSIS — K625 Hemorrhage of anus and rectum: Secondary | ICD-10-CM

## 2024-02-14 DIAGNOSIS — K529 Noninfective gastroenteritis and colitis, unspecified: Secondary | ICD-10-CM

## 2024-02-14 DIAGNOSIS — R195 Other fecal abnormalities: Secondary | ICD-10-CM

## 2024-02-14 NOTE — Progress Notes (Signed)
 Primary Care Physician:  Leonce Lucie JINNY DEVONNA Primary Gastroenterologist:  Dr. Cindie  Chief Complaint  Patient presents with   New Patient (Initial Visit)    Pt referred for colitis    HPI:   Rebekah Ferrell is a 58 y.o. female who presents to the clinic today for ER follow-up visit.  Has not been seen in our office since 2018.  Patient reports she was in her normal state of health until last week while in [place]  when she had sudden onset of severe abdominal pain, rectal bleeding, mucus in stool.  She was seen by PCP in [place] who recommended she go to the ER for evaluation.  ER visit 02/11/2024.  CT abdomen pelvis which I personally reviewed showed mild colitis of the descending colon.  Treated conservatively and discharged home.  Was given antibiotics by her PCP but states she has not started these.  Today, states she is overall improved.  Notes mild abdominal pain though nowhere near as severe as it was.  When she was having pain it was primarily left lower quadrant.  No longer having diarrhea.  No previous colonoscopy.  No family history of inflammatory bowel disease.  No family history of colorectal malignancy.  Does have chronic GERD which is well-controlled on omeprazole twice daily.  Denies any dysphagia, odynophagia, epigastric or chest pain.  Past Medical History:  Diagnosis Date   Anxiety    Cancer (HCC)    skin    Chronic kidney disease    kidney stone   Diabetes mellitus without complication (HCC)    Hormone replacement therapy (HRT) 11/18/2013   Hot flashes 07/15/2013   Hyperlipidemia    Hypertension    Menopause 09/29/2013   Psoriatic arthritis (HCC)    Spongiotic psoriasiform dermatitis    SUI (stress urinary incontinence, female)    Tachycardia     Past Surgical History:  Procedure Laterality Date   CARDIAC ELECTROPHYSIOLOGY MAPPING AND ABLATION     CHOLECYSTECTOMY     ENDOMETRIAL ABLATION     TUBAL LIGATION      Current Outpatient  Medications  Medication Sig Dispense Refill   ALPRAZolam  (XANAX ) 0.5 MG tablet TAKE 1 TABLET BY MOUTH 3 TIMES A DAY AS NEEDED FOR ANXIETY. 90 tablet 0   Cholecalciferol (VITAMIN D3) 2000 units TABS Take by mouth daily.      empagliflozin (JARDIANCE) 25 MG TABS tablet Take 25 mg by mouth daily.     Guselkumab (TREMFYA) 100 MG/ML SOPN Tremfya 100 mg/mL subcutaneous auto-injector     insulin degludec (TRESIBA FLEXTOUCH) 200 UNIT/ML FlexTouch Pen Inject 200 Units into the skin daily.     lisinopril (PRINIVIL,ZESTRIL) 2.5 MG tablet Take 2.5 mg by mouth daily.      loratadine (CLARITIN) 10 MG tablet Take 10 mg by mouth daily.     metFORMIN (GLUCOPHAGE-XR) 500 MG 24 hr tablet Take 1,000 mg by mouth 2 (two) times daily.     naproxen (NAPROSYN) 500 MG tablet Take 500 mg by mouth 2 (two) times daily with a meal.      nitrofurantoin , macrocrystal-monohydrate, (MACROBID ) 100 MG capsule Take 1 capsule (100 mg total) by mouth 2 (two) times daily. 14 capsule 0   omeprazole (PRILOSEC) 20 MG capsule Take 20 mg by mouth 2 (two) times daily before a meal.     ondansetron  (ZOFRAN -ODT) 4 MG disintegrating tablet 4mg  ODT q4 hours prn nausea/vomit 10 tablet 0   Semaglutide, 1 MG/DOSE, (OZEMPIC, 1 MG/DOSE,) 4 MG/3ML SOPN Ozempic  1 mg/dose (4 mg/3 mL) subcutaneous pen injector     sertraline  (ZOLOFT ) 50 MG tablet TAKE 1 TABLET BY MOUTH ONCE DAILY. 90 tablet 0   amoxicillin-clavulanate (AUGMENTIN) 875-125 MG tablet Take 1 tablet by mouth every 12 (twelve) hours. (Patient not taking: Reported on 02/14/2024) 14 tablet 0   Biotin 5000 MCG CAPS Take 1 capsule by mouth daily. (Patient not taking: Reported on 02/14/2024)     finasteride (PROPECIA) 1 MG tablet Take 1 mg by mouth daily. (Patient not taking: Reported on 02/14/2024)     No current facility-administered medications for this visit.    Allergies as of 02/14/2024 - Review Complete 02/14/2024  Allergen Reaction Noted   Benadryl  [diphenhydramine ] Other (See Comments)  01/05/2016   Levaquin [levofloxacin in d5w] Swelling 07/15/2013   Mold extract [trichophyton] Hives and Swelling 01/05/2016   Sulfa antibiotics Hives 07/15/2013    Family History  Problem Relation Age of Onset   Hypertension Mother    Brain cancer Mother    Diabetes Father    Hypertension Father    Cancer Father        bladder   Diabetes Sister    Cancer Maternal Grandmother        breast   Stroke Maternal Grandmother    Colon polyps Neg Hx    Colon cancer Neg Hx     Social History   Socioeconomic History   Marital status: Married    Spouse name: Not on file   Number of children: Not on file   Years of education: Not on file   Highest education level: Not on file  Occupational History   Occupation: Development worker, international aid and [employer]   Tobacco Use   Smoking status: Former    Types: Cigarettes   Smokeless tobacco: Never   Tobacco comments:    quit in late 20s   Substance and Sexual Activity   Alcohol use: No   Drug use: No   Sexual activity: Yes    Birth control/protection: Surgical    Comment: tubal and ablation  Other Topics Concern   Not on file  Social History Narrative   Not on file   Social Drivers of Health   Financial Resource Strain: Not on file  Food Insecurity: Not on file  Transportation Needs: Not on file  Physical Activity: Not on file  Stress: Not on file  Social Connections: Unknown (09/26/2021)   Received from [facility]   Social Network    Social Network: Not on file  Intimate Partner Violence: Unknown (08/18/2021)   Received from [facility]   HITS    Physically Hurt: Not on file    Insult or Talk Down To: Not on file    Threaten Physical Harm: Not on file    Scream or Curse: Not on file    Subjective: Review of Systems  Constitutional:  Negative for chills and fever.  HENT:  Negative for congestion and hearing loss.   Eyes:  Negative for blurred vision and double vision.  Respiratory:  Negative for cough and  shortness of breath.   Cardiovascular:  Negative for chest pain and palpitations.  Gastrointestinal:  Negative for abdominal pain, blood in stool, constipation, diarrhea, heartburn, melena and vomiting.  Genitourinary:  Negative for dysuria and urgency.  Musculoskeletal:  Negative for joint pain and myalgias.  Skin:  Negative for itching and rash.  Neurological:  Negative for dizziness and headaches.  Psychiatric/Behavioral:  Negative for depression. The patient is not nervous/anxious.  Objective: BP 120/74   Pulse 94   Temp 98.6 F (37 C)   Ht 5' 2 (1.575 m)   Wt 167 lb 12.8 oz (76.1 kg)   BMI 30.69 kg/m  Physical Exam Constitutional:      Appearance: Normal appearance.  HENT:     Head: Normocephalic and atraumatic.  Eyes:     Extraocular Movements: Extraocular movements intact.     Conjunctiva/sclera: Conjunctivae normal.  Cardiovascular:     Rate and Rhythm: Normal rate and regular rhythm.  Pulmonary:     Effort: Pulmonary effort is normal.     Breath sounds: Normal breath sounds.  Abdominal:     General: Bowel sounds are normal.     Palpations: Abdomen is soft.  Musculoskeletal:        General: No swelling. Normal range of motion.     Cervical back: Normal range of motion and neck supple.  Skin:    General: Skin is warm and dry.     Coloration: Skin is not jaundiced.  Neurological:     General: No focal deficit present.     Mental Status: She is alert and oriented to person, place, and time.  Psychiatric:        Mood and Affect: Mood normal.        Behavior: Behavior normal.      Assessment/Plan:  1.  Colitis-symptoms improved.  No longer having abdominal pain.  Diarrhea has resolved.  Okay to hold off on antibiotics for now.  Will plan on colonoscopy in 4 to 6 weeks to evaluate.  The risks including infection, bleed, or perforation as well as benefits, limitations, alternatives and imponderables have been reviewed with the patient. Questions have  been answered. All parties agreeable.  2.  Chronic GERD-well-controlled on omeprazole twice daily.  Will continue.  Follow-up after colonoscopy.   02/14/2024 2:21 PM

## 2024-02-14 NOTE — Patient Instructions (Signed)
 I am happy to hear that you are feeling better.  You can hold off on starting your antibiotics unless you have a resurgence of your symptoms.  We will plan on colonoscopy in 4 to 6 weeks to evaluate.  Continue on omeprazole for your chronic acid reflux.  It was very nice meeting you today.  Dr. Cindie

## 2024-02-18 ENCOUNTER — Telehealth (INDEPENDENT_AMBULATORY_CARE_PROVIDER_SITE_OTHER): Payer: Self-pay | Admitting: *Deleted

## 2024-02-18 NOTE — Telephone Encounter (Signed)
 Attempted to call pt, unable to leave message due to mailbox being full  TCS w/DR.Carver, asa 2 in 4-6 weeks from visit on 02/14/24.

## 2024-02-19 NOTE — Telephone Encounter (Signed)
Attempted to call pt, unable to leave message due to mailbox being full.

## 2024-02-20 ENCOUNTER — Other Ambulatory Visit: Payer: Self-pay | Admitting: *Deleted

## 2024-02-20 ENCOUNTER — Encounter: Payer: Self-pay | Admitting: *Deleted

## 2024-02-20 MED ORDER — PEG 3350-KCL-NA BICARB-NACL 420 G PO SOLR: 4000.0000 mL | Freq: Once | ORAL | 0 refills | Status: AC

## 2024-02-20 NOTE — Telephone Encounter (Signed)
 Pt has been scheduled for 03/18/24. Instructions mailed and prep sent to pharmacy

## 2024-02-22 ENCOUNTER — Encounter: Payer: Self-pay | Admitting: *Deleted

## 2024-03-17 ENCOUNTER — Other Ambulatory Visit: Payer: Self-pay | Admitting: Medical Genetics

## 2024-03-18 ENCOUNTER — Other Ambulatory Visit: Payer: Self-pay

## 2024-03-18 ENCOUNTER — Encounter (HOSPITAL_COMMUNITY): Payer: Self-pay | Admitting: Internal Medicine

## 2024-03-18 ENCOUNTER — Ambulatory Visit (HOSPITAL_COMMUNITY): Admitting: Anesthesiology

## 2024-03-18 ENCOUNTER — Ambulatory Visit (HOSPITAL_COMMUNITY)
Admission: RE | Admit: 2024-03-18 | Discharge: 2024-03-18 | Disposition: A | Discharge status: Home / Self Care | Attending: Internal Medicine | Admitting: Internal Medicine

## 2024-03-18 ENCOUNTER — Encounter (HOSPITAL_COMMUNITY)
Admission: RE | Disposition: A | Discharge status: Home / Self Care | Payer: Self-pay | Source: Home / Self Care | Attending: Internal Medicine

## 2024-03-18 DIAGNOSIS — Z87891 Personal history of nicotine dependence: Secondary | ICD-10-CM | POA: Insufficient documentation

## 2024-03-18 DIAGNOSIS — I10 Essential (primary) hypertension: Secondary | ICD-10-CM | POA: Diagnosis not present

## 2024-03-18 DIAGNOSIS — Z7985 Long-term (current) use of injectable non-insulin antidiabetic drugs: Secondary | ICD-10-CM | POA: Insufficient documentation

## 2024-03-18 DIAGNOSIS — K529 Noninfective gastroenteritis and colitis, unspecified: Secondary | ICD-10-CM | POA: Diagnosis present

## 2024-03-18 DIAGNOSIS — E1122 Type 2 diabetes mellitus with diabetic chronic kidney disease: Secondary | ICD-10-CM | POA: Diagnosis not present

## 2024-03-18 DIAGNOSIS — I129 Hypertensive chronic kidney disease with stage 1 through stage 4 chronic kidney disease, or unspecified chronic kidney disease: Secondary | ICD-10-CM | POA: Insufficient documentation

## 2024-03-18 DIAGNOSIS — K648 Other hemorrhoids: Secondary | ICD-10-CM

## 2024-03-18 DIAGNOSIS — Z780 Asymptomatic menopausal state: Secondary | ICD-10-CM | POA: Diagnosis not present

## 2024-03-18 DIAGNOSIS — M1990 Unspecified osteoarthritis, unspecified site: Secondary | ICD-10-CM | POA: Diagnosis not present

## 2024-03-18 DIAGNOSIS — Z7984 Long term (current) use of oral hypoglycemic drugs: Secondary | ICD-10-CM | POA: Diagnosis not present

## 2024-03-18 DIAGNOSIS — N189 Chronic kidney disease, unspecified: Secondary | ICD-10-CM | POA: Diagnosis not present

## 2024-03-18 DIAGNOSIS — Z8719 Personal history of other diseases of the digestive system: Secondary | ICD-10-CM

## 2024-03-18 DIAGNOSIS — Z794 Long term (current) use of insulin: Secondary | ICD-10-CM | POA: Insufficient documentation

## 2024-03-18 HISTORY — DX: Other specified postprocedural states: Z98.890

## 2024-03-18 HISTORY — PX: COLONOSCOPY: SHX5424

## 2024-03-18 LAB — GLUCOSE, CAPILLARY: Glucose-Capillary: 154 mg/dL — ABNORMAL HIGH (ref 70–99)

## 2024-03-18 SURGERY — COLONOSCOPY
Anesthesia: General

## 2024-03-18 MED ORDER — PROPOFOL 500 MG/50ML IV EMUL
INTRAVENOUS | Status: DC | PRN
  Administered 2024-03-18: 200 ug/kg/min via INTRAVENOUS

## 2024-03-18 MED ORDER — LACTATED RINGERS IV SOLN: INTRAVENOUS | Status: DC

## 2024-03-18 MED ORDER — PROPOFOL 10 MG/ML IV BOLUS
INTRAVENOUS | Status: DC | PRN
  Administered 2024-03-18: 100 mg via INTRAVENOUS

## 2024-03-18 NOTE — Anesthesia Postprocedure Evaluation (Signed)
 Anesthesia Post Note  Patient: Rebekah Ferrell  Procedure(s) Performed: COLONOSCOPY  Patient location during evaluation: Endoscopy Anesthesia Type: General Level of consciousness: awake and alert Pain management: pain level controlled Vital Signs Assessment: post-procedure vital signs reviewed and stable Respiratory status: spontaneous breathing, nonlabored ventilation and respiratory function stable Cardiovascular status: stable Anesthetic complications: no   There were no known notable events for this encounter.   Last Vitals:  Vitals:   03/18/24 0942 03/18/24 0944  BP: (!) 87/48 102/63  Pulse: 72 75  Resp: 13 17  Temp: 36.4 C   SpO2: 97% 98%    Last Pain:  Vitals:   03/18/24 0944  TempSrc:   PainSc: 0-No pain                 Shahed Yeoman L Delrae Hagey

## 2024-03-18 NOTE — Transfer of Care (Signed)
 Immediate Anesthesia Transfer of Care Note  Patient: Rebekah Ferrell  Procedure(s) Performed: COLONOSCOPY  Patient Location: Endoscopy Unit  Anesthesia Type:General  Level of Consciousness: awake, alert , oriented, and patient cooperative  Airway & Oxygen Therapy: Patient Spontanous Breathing  Post-op Assessment: Report given to RN, Post -op Vital signs reviewed and stable, and Patient moving all extremities X 4  Post vital signs: Reviewed and stable  Last Vitals:  Vitals Value Taken Time  BP 87/48 03/18/24 09:42  Temp 36.4 C 03/18/24 09:42  Pulse 72 03/18/24 09:42  Resp 13 03/18/24 09:42  SpO2 97 % 03/18/24 09:42    Last Pain:  Vitals:   03/18/24 0942  TempSrc: Oral  PainSc:       Patients Stated Pain Goal: 10 (03/18/24 0839)  Complications: No notable events documented.

## 2024-03-18 NOTE — H&P (Signed)
 Primary Care Physician:  Leonce Lucie PARAS, PA-C Primary Gastroenterologist:  Dr. Cindie  Pre-Procedure History & Physical: HPI:  Rebekah Ferrell is a 58 y.o. female is here  for a colonoscopy to be performed for follow up of colitis  Past Medical History:  Diagnosis Date   Anxiety    Cancer (HCC)    skin    Chronic kidney disease    kidney stone   Diabetes mellitus without complication (HCC)    Hormone replacement therapy (HRT) 11/18/2013   Hot flashes 07/15/2013   Hyperlipidemia    Hypertension    Menopause 09/29/2013   PONV (postoperative nausea and vomiting)    Psoriatic arthritis (HCC)    Spongiotic psoriasiform dermatitis    SUI (stress urinary incontinence, female)    Tachycardia     Past Surgical History:  Procedure Laterality Date   CARDIAC ELECTROPHYSIOLOGY MAPPING AND ABLATION     CHOLECYSTECTOMY     ENDOMETRIAL ABLATION     TUBAL LIGATION      Prior to Admission medications   Medication Sig Start Date End Date Taking? Authorizing Provider  ALPRAZolam  (XANAX ) 0.5 MG tablet TAKE 1 TABLET BY MOUTH 3 TIMES A DAY AS NEEDED FOR ANXIETY. 01/29/19  Yes Kizzie Suzen JONELLE, CNM  lisinopril (PRINIVIL,ZESTRIL) 2.5 MG tablet Take 2.5 mg by mouth daily.  10/02/16  Yes [provider]  loratadine (CLARITIN) 10 MG tablet Take 10 mg by mouth daily.   Yes [provider]  metFORMIN (GLUCOPHAGE-XR) 500 MG 24 hr tablet Take 1,000 mg by mouth 2 (two) times daily. 12/22/16  Yes [provider]  naproxen (NAPROSYN) 500 MG tablet Take 500 mg by mouth 2 (two) times daily with a meal.  10/04/16  Yes [provider]  nitrofurantoin , macrocrystal-monohydrate, (MACROBID ) 100 MG capsule Take 1 capsule (100 mg total) by mouth 2 (two) times daily. 10/02/17  Yes Signa Delon LABOR, NP  omeprazole (PRILOSEC) 20 MG capsule Take 20 mg by mouth 2 (two) times daily before a meal.   Yes [provider]  ondansetron  (ZOFRAN -ODT) 4 MG disintegrating tablet  4mg  ODT q4 hours prn nausea/vomit 02/11/24  Yes Patt Alm Macho, MD  sertraline  (ZOLOFT ) 50 MG tablet TAKE 1 TABLET BY MOUTH ONCE DAILY. 02/28/19  Yes Signa Delon A, NP  amoxicillin-clavulanate (AUGMENTIN) 875-125 MG tablet Take 1 tablet by mouth every 12 (twelve) hours. Patient not taking: Reported on 02/14/2024 02/11/24   Patt Alm Macho, MD  Biotin 5000 MCG CAPS Take 1 capsule by mouth daily. Patient not taking: Reported on 02/14/2024    [provider]  Cholecalciferol (VITAMIN D3) 2000 units TABS Take by mouth daily.     [provider]  empagliflozin (JARDIANCE) 25 MG TABS tablet Take 25 mg by mouth daily.    [provider]  finasteride (PROPECIA) 1 MG tablet Take 1 mg by mouth daily. Patient not taking: Reported on 02/14/2024 11/25/20   [provider]  Guselkumab (TREMFYA) 100 MG/ML SOPN Tremfya 100 mg/mL subcutaneous auto-injector    [provider]  insulin degludec (TRESIBA FLEXTOUCH) 200 UNIT/ML FlexTouch Pen Inject 200 Units into the skin daily.    [provider]  Semaglutide, 1 MG/DOSE, (OZEMPIC, 1 MG/DOSE,) 4 MG/3ML SOPN Ozempic 1 mg/dose (4 mg/3 mL) subcutaneous pen injector    [provider]    Allergies as of 02/20/2024 - Review Complete 02/14/2024  Allergen Reaction Noted   Benadryl  [diphenhydramine ] Other (See Comments) 01/05/2016   Levaquin [levofloxacin in d5w] Swelling 07/15/2013  Mold extract [trichophyton] Hives and Swelling 01/05/2016   Sulfa antibiotics Hives 07/15/2013    Family History  Problem Relation Age of Onset   Hypertension Mother    Brain cancer Mother    Diabetes Father    Hypertension Father    Cancer Father        bladder   Diabetes Sister    Cancer Maternal Grandmother        breast   Stroke Maternal Grandmother    Colon polyps Neg Hx    Colon cancer Neg Hx     Social History   Socioeconomic History   Marital status: Married    Spouse name: Not on file   Number  of children: Not on file   Years of education: Not on file   Highest education level: Not on file  Occupational History   Occupation: Development Worker, [employer] and [employer]   Tobacco Use   Smoking status: Former    Types: Cigarettes   Smokeless tobacco: Never   Tobacco comments:    quit in late 20s   Vaping Use   Vaping status: Never Used  Substance and Sexual Activity   Alcohol use: No   Drug use: No   Sexual activity: Yes    Birth control/protection: Surgical    Comment: tubal and ablation  Other Topics Concern   Not on file  Social History Narrative   Not on file   Social Drivers of Health   Financial Resource Strain: Not on file  Food Insecurity: Not on file  Transportation Needs: Not on file  Physical Activity: Not on file  Stress: Not on file  Social Connections: Unknown (09/26/2021)   Received from [facility]   Social Network    Social Network: Not on file  Intimate Partner Violence: Unknown (08/18/2021)   Received from [facility]   HITS    Physically Hurt: Not on file    Insult or Talk Down To: Not on file    Threaten Physical Harm: Not on file    Scream or Curse: Not on file    Review of Systems: See HPI, otherwise negative ROS  Physical Exam: Vital signs in last 24 hours:     General:   Alert,  Well-developed, well-nourished, pleasant and cooperative in NAD Head:  Normocephalic and atraumatic. Eyes:  Sclera clear, no icterus.   Conjunctiva pink. Ears:  Normal auditory acuity. Nose:  No deformity, discharge,  or lesions. Msk:  Symmetrical without gross deformities. Normal posture. Extremities:  Without clubbing or edema. Neurologic:  Alert and  oriented x4;  grossly normal neurologically. Skin:  Intact without significant lesions or rashes. Psych:  Alert and cooperative. Normal mood and affect.  Impression/Plan: Rebekah Ferrell is here for a colonoscopy to be performed for follow up of colitis  The risks of the procedure  including infection, bleed, or perforation as well as benefits, limitations, alternatives and imponderables have been reviewed with the patient. Questions have been answered. All parties agreeable.

## 2024-03-18 NOTE — Discharge Instructions (Addendum)
  Colonoscopy Discharge Instructions  Read the instructions outlined below and refer to this sheet in the next few weeks. These discharge instructions provide you with general information on caring for yourself after you leave the hospital. Your doctor may also give you specific instructions. While your treatment has been planned according to the most current medical practices available, unavoidable complications occasionally occur.   ACTIVITY You may resume your regular activity, but move at a slower pace for the next 24 hours.  Take frequent rest periods for the next 24 hours.  Walking will help get rid of the air and reduce the bloated feeling in your belly (abdomen).  No driving for 24 hours (because of the medicine (anesthesia) used during the test).   Do not sign any important legal documents or operate any machinery for 24 hours (because of the anesthesia used during the test).  NUTRITION Drink plenty of fluids.  You may resume your normal diet as instructed by your doctor.  Begin with a light meal and progress to your normal diet. Heavy or fried foods are harder to digest and may make you feel sick to your stomach (nauseated).  Avoid alcoholic beverages for 24 hours or as instructed.  MEDICATIONS You may resume your normal medications unless your doctor tells you otherwise.  WHAT YOU CAN EXPECT TODAY Some feelings of bloating in the abdomen.  Passage of more gas than usual.  Spotting of blood in your stool or on the toilet paper.  IF YOU HAD POLYPS REMOVED DURING THE COLONOSCOPY: No aspirin products for 7 days or as instructed.  No alcohol for 7 days or as instructed.  Eat a soft diet for the next 24 hours.  FINDING OUT THE RESULTS OF YOUR TEST Not all test results are available during your visit. If your test results are not back during the visit, make an appointment with your caregiver to find out the results. Do not assume everything is normal if you have not heard from your  caregiver or the medical facility. It is important for you to follow up on all of your test results.  SEEK IMMEDIATE MEDICAL ATTENTION IF: You have more than a spotting of blood in your stool.  Your belly is swollen (abdominal distention).  You are nauseated or vomiting.  You have a temperature over 101.  You have abdominal pain or discomfort that is severe or gets worse throughout the day.   Your colonoscopy was relatively unremarkable.  I did not find any polyps or evidence of colon cancer.  I recommend repeating colonoscopy in 10 years for colon cancer screening purposes.    Overall, your colon appeared very healthy.  I did not see any active inflammation indicative of underlying inflammatory bowel disease such as Crohn's disease or ulcerative colitis throughout your colon or end portion of your small bowel.    Otherwise follow up with GI as needed.   I hope you have a great rest of your week!  Carlin POUR. Cindie, D.O. Gastroenterology and Hepatology [facility]

## 2024-03-18 NOTE — Op Note (Signed)
 [facility] Patient Name: Rebekah Ferrell Procedure Date: 03/18/2024 9:03 AM MRN: [id number] Date of Birth: 1966-04-21 Attending MD: Carlin POUR. Cindie , [place], [id number] CSN: [id number] Age: 58 Admit Type: Outpatient Procedure:                Colonoscopy Indications:              Follow-up of colitis Providers:                Carlin POUR. Cindie, DO, Leandrew Edelman RN, RN, Bascom Blush Referring MD:              Medicines:                See the Anesthesia note for documentation of the                            administered medications Complications:            No immediate complications. Estimated Blood Loss:     Estimated blood loss: none. Procedure:                Pre-Anesthesia Assessment:                           - The anesthesia plan was to use monitored                            anesthesia care (MAC).                           After obtaining informed consent, the colonoscope                            was passed under direct vision. Throughout the                            procedure, the patient's blood pressure, pulse, and                            oxygen saturations were monitored continuously. The                            [id number]-HQ190L ([id number]) Peds Colon was introduced                            through the anus and advanced to the the terminal                            ileum, with identification of the appendiceal                            orifice and IC valve. The colonoscopy was performed                            without difficulty. The patient tolerated the  procedure well. The quality of the bowel                            preparation was evaluated using the BBPS [facility]                            Bowel Preparation Scale) with scores of: Right                            Colon = 3, Transverse Colon = 3 and Left Colon = 3                            (entire mucosa seen well with no residual staining,                             small fragments of stool or opaque liquid). The                            total BBPS score equals 9. Scope In: 9:21:54 AM Scope Out: 9:37:18 AM Scope Withdrawal Time: 0 hours 7 minutes 1 second  Total Procedure Duration: 0 hours 15 minutes 24 seconds  Findings:      Non-bleeding internal hemorrhoids were found.      The terminal ileum appeared normal.      The entire examined colon appeared normal. Impression:               - Non-bleeding internal hemorrhoids.                           - The examined portion of the ileum was normal.                           - The entire examined colon is normal.                           - No specimens collected. Moderate Sedation:      Per Anesthesia Care Recommendation:           - Patient has a contact number available for                            emergencies. The signs and symptoms of potential                            delayed complications were discussed with the                            patient. Return to normal activities tomorrow.                            Written discharge instructions were provided to the                            patient.                           -  Resume previous diet.                           - Continue present medications.                           - Repeat colonoscopy in 10 years for screening                            purposes.                           - Return to GI clinic PRN.                           **Previously noted colitis has healed** Procedure Code(s):        --- Professional ---                           [phone], Colonoscopy, flexible; diagnostic, including                            collection of specimen(s) by brushing or washing,                            when performed (separate procedure) Diagnosis Code(s):        --- Professional ---                           K64.8, Other hemorrhoids                           K52.9, Noninfective gastroenteritis and colitis,                             unspecified CPT copyright 2022 American Medical Association. All rights reserved. The codes documented in this report are preliminary and upon coder review may  be revised to meet current compliance requirements. Carlin POUR. Cindie, DO Carlin POUR. Cindie, DO 03/18/2024 9:40:12 AM This report has been signed electronically. Number of Addenda: 0

## 2024-03-18 NOTE — Anesthesia Preprocedure Evaluation (Signed)
 Anesthesia Evaluation  Patient identified by MRN, date of birth, ID band Patient awake    Reviewed: Allergy & Precautions, H&P , NPO status , Patient's Chart, lab work & pertinent test results, reviewed documented beta blocker date and time   History of Anesthesia Complications (+) PONV and history of anesthetic complications  Airway Mallampati: II  TM Distance: >3 FB Neck ROM: full    Dental no notable dental hx. (+) Teeth Intact, Dental Advisory Given   Pulmonary neg pulmonary ROS, former smoker   Pulmonary exam normal breath sounds clear to auscultation       Cardiovascular Exercise Tolerance: Good hypertension, negative cardio ROS  Rhythm:regular Rate:Normal     Neuro/Psych negative neurological ROS  negative psych ROS   GI/Hepatic negative GI ROS, Neg liver ROS,,,  Endo/Other  negative endocrine ROSdiabetes, Type 2    Renal/GU CRFRenal diseaseRenal failure in history but labs look OK  negative genitourinary   Musculoskeletal  (+) Arthritis , Osteoarthritis,    Abdominal   Peds  Hematology negative hematology ROS (+)   Anesthesia Other Findings cancer  Reproductive/Obstetrics negative OB ROS                              Anesthesia Physical Anesthesia Plan  ASA: 3  Anesthesia Plan: General   Post-op Pain Management: Minimal or no pain anticipated   Induction: Intravenous  PONV Risk Score and Plan: Propofol infusion  Airway Management Planned: Natural Airway and Nasal Cannula  Additional Equipment: None  Intra-op Plan:   Post-operative Plan:   Informed Consent: I have reviewed the patients History and Physical, chart, labs and discussed the procedure including the risks, benefits and alternatives for the proposed anesthesia with the patient or authorized representative who has indicated his/her understanding and acceptance.     Dental Advisory Given  Plan Discussed  with: CRNA  Anesthesia Plan Comments:          Anesthesia Quick Evaluation

## 2024-03-19 ENCOUNTER — Encounter (HOSPITAL_COMMUNITY): Payer: Self-pay | Admitting: Internal Medicine
